# Patient Record
Sex: Female | Born: 1955
Health system: Southern US, Community
[De-identification: ages and names within clinical notes are randomized; demographics above are authoritative.]

## PROBLEM LIST (undated history)

## (undated) DIAGNOSIS — J189 Pneumonia, unspecified organism: Secondary | ICD-10-CM

## (undated) DIAGNOSIS — Z974 Presence of external hearing-aid: Secondary | ICD-10-CM

## (undated) DIAGNOSIS — K219 Gastro-esophageal reflux disease without esophagitis: Secondary | ICD-10-CM

## (undated) DIAGNOSIS — T7840XA Allergy, unspecified, initial encounter: Secondary | ICD-10-CM

## (undated) DIAGNOSIS — D509 Iron deficiency anemia, unspecified: Secondary | ICD-10-CM

## (undated) DIAGNOSIS — H269 Unspecified cataract: Secondary | ICD-10-CM

## (undated) DIAGNOSIS — Z9889 Other specified postprocedural states: Secondary | ICD-10-CM

## (undated) DIAGNOSIS — F32A Depression, unspecified: Secondary | ICD-10-CM

## (undated) DIAGNOSIS — D689 Coagulation defect, unspecified: Secondary | ICD-10-CM

## (undated) DIAGNOSIS — M1990 Unspecified osteoarthritis, unspecified site: Secondary | ICD-10-CM

## (undated) DIAGNOSIS — Z5189 Encounter for other specified aftercare: Secondary | ICD-10-CM

## (undated) DIAGNOSIS — F419 Anxiety disorder, unspecified: Secondary | ICD-10-CM

## (undated) DIAGNOSIS — E039 Hypothyroidism, unspecified: Secondary | ICD-10-CM

## (undated) DIAGNOSIS — I10 Essential (primary) hypertension: Secondary | ICD-10-CM

## (undated) DIAGNOSIS — E785 Hyperlipidemia, unspecified: Secondary | ICD-10-CM

## (undated) DIAGNOSIS — R112 Nausea with vomiting, unspecified: Secondary | ICD-10-CM

## (undated) DIAGNOSIS — C801 Malignant (primary) neoplasm, unspecified: Secondary | ICD-10-CM

## (undated) DIAGNOSIS — D649 Anemia, unspecified: Secondary | ICD-10-CM

## (undated) DIAGNOSIS — G4730 Sleep apnea, unspecified: Secondary | ICD-10-CM

## (undated) HISTORY — PX: APPENDECTOMY: SHX54

## (undated) HISTORY — DX: Coagulation defect, unspecified: D68.9

## (undated) HISTORY — DX: Hypothyroidism, unspecified: E03.9

## (undated) HISTORY — PX: BLEPHAROPLASTY: SUR158

## (undated) HISTORY — DX: Depression, unspecified: F32.A

## (undated) HISTORY — DX: Encounter for other specified aftercare: Z51.89

## (undated) HISTORY — DX: Allergy, unspecified, initial encounter: T78.40XA

## (undated) HISTORY — PX: BREAST BIOPSY: SHX20

## (undated) HISTORY — DX: Anemia, unspecified: D64.9

## (undated) HISTORY — PX: HEEL SPUR SURGERY: SHX665

## (undated) HISTORY — DX: Essential (primary) hypertension: I10

## (undated) HISTORY — DX: Unspecified cataract: H26.9

## (undated) HISTORY — DX: Hyperlipidemia, unspecified: E78.5

## (undated) HISTORY — PX: VAGINAL HYSTERECTOMY: SUR661

## (undated) HISTORY — DX: Iron deficiency anemia, unspecified: D50.9

## (undated) HISTORY — DX: Sleep apnea, unspecified: G47.30

---

## 1985-08-16 DIAGNOSIS — Z862 Personal history of diseases of the blood and blood-forming organs and certain disorders involving the immune mechanism: Secondary | ICD-10-CM

## 1985-08-16 HISTORY — DX: Personal history of diseases of the blood and blood-forming organs and certain disorders involving the immune mechanism: Z86.2

## 1999-03-26 ENCOUNTER — Other Ambulatory Visit: Admission: RE | Admit: 1999-03-26 | Discharge: 1999-03-26 | Payer: Self-pay | Admitting: *Deleted

## 1999-03-26 ENCOUNTER — Encounter (INDEPENDENT_AMBULATORY_CARE_PROVIDER_SITE_OTHER): Payer: Self-pay | Admitting: Specialist

## 2001-01-10 ENCOUNTER — Other Ambulatory Visit: Admission: RE | Admit: 2001-01-10 | Discharge: 2001-01-10 | Payer: Self-pay | Admitting: *Deleted

## 2002-08-24 ENCOUNTER — Other Ambulatory Visit: Admission: RE | Admit: 2002-08-24 | Discharge: 2002-08-24 | Payer: Self-pay | Admitting: Obstetrics and Gynecology

## 2003-08-27 ENCOUNTER — Other Ambulatory Visit: Admission: RE | Admit: 2003-08-27 | Discharge: 2003-08-27 | Payer: Self-pay | Admitting: Obstetrics and Gynecology

## 2005-09-15 ENCOUNTER — Observation Stay (HOSPITAL_COMMUNITY): Admission: RE | Admit: 2005-09-15 | Discharge: 2005-09-16 | Payer: Self-pay | Admitting: Obstetrics and Gynecology

## 2005-09-15 ENCOUNTER — Encounter (INDEPENDENT_AMBULATORY_CARE_PROVIDER_SITE_OTHER): Payer: Self-pay | Admitting: Specialist

## 2005-10-09 ENCOUNTER — Inpatient Hospital Stay (HOSPITAL_COMMUNITY): Admission: AD | Admit: 2005-10-09 | Discharge: 2005-10-09 | Payer: Self-pay | Admitting: Obstetrics and Gynecology

## 2007-11-28 ENCOUNTER — Ambulatory Visit: Payer: Self-pay | Admitting: Gastroenterology

## 2007-12-11 ENCOUNTER — Ambulatory Visit: Payer: Self-pay | Admitting: Gastroenterology

## 2008-05-23 ENCOUNTER — Ambulatory Visit: Payer: Self-pay | Admitting: Endocrinology

## 2008-06-18 ENCOUNTER — Ambulatory Visit: Payer: Self-pay | Admitting: Internal Medicine

## 2008-06-18 DIAGNOSIS — J45909 Unspecified asthma, uncomplicated: Secondary | ICD-10-CM | POA: Insufficient documentation

## 2008-06-18 DIAGNOSIS — E039 Hypothyroidism, unspecified: Secondary | ICD-10-CM | POA: Insufficient documentation

## 2008-06-18 DIAGNOSIS — J8409 Other alveolar and parieto-alveolar conditions: Secondary | ICD-10-CM | POA: Insufficient documentation

## 2008-06-18 DIAGNOSIS — J42 Unspecified chronic bronchitis: Secondary | ICD-10-CM | POA: Insufficient documentation

## 2008-06-18 DIAGNOSIS — M7730 Calcaneal spur, unspecified foot: Secondary | ICD-10-CM | POA: Insufficient documentation

## 2008-06-18 DIAGNOSIS — R05 Cough: Secondary | ICD-10-CM

## 2008-06-18 DIAGNOSIS — T7840XA Allergy, unspecified, initial encounter: Secondary | ICD-10-CM | POA: Insufficient documentation

## 2008-06-18 DIAGNOSIS — I10 Essential (primary) hypertension: Secondary | ICD-10-CM | POA: Insufficient documentation

## 2008-06-18 DIAGNOSIS — R059 Cough, unspecified: Secondary | ICD-10-CM | POA: Insufficient documentation

## 2008-08-08 ENCOUNTER — Emergency Department (HOSPITAL_COMMUNITY): Admission: EM | Admit: 2008-08-08 | Discharge: 2008-08-08 | Payer: Self-pay | Admitting: Emergency Medicine

## 2008-08-19 ENCOUNTER — Ambulatory Visit: Payer: Self-pay | Admitting: Internal Medicine

## 2008-08-19 DIAGNOSIS — G4730 Sleep apnea, unspecified: Secondary | ICD-10-CM | POA: Insufficient documentation

## 2009-01-07 IMAGING — CT CT CHEST W/O CM
1 series · 15 of 33 positions shown, 19 images · non-contrast
Comparison: none

REASON FOR EXAM: Coughing, bronchitis, evaluate lung CA
COMMENTS:

[Series 2: soft tissue · axial · 0.83mm/px · z∈[-18,+287]mm · 15 of 73 slices shown, 19 images]
[im 6/73  mediastinal]
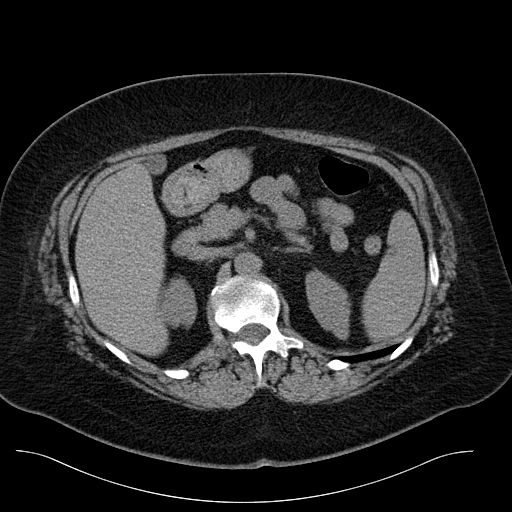
[im 6/73  lung]
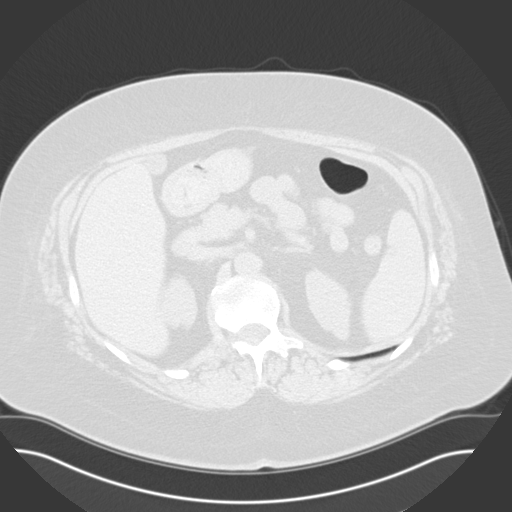
[im 11/73  lung]
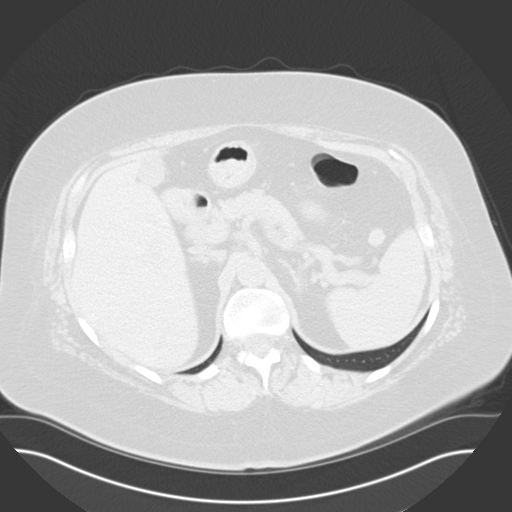
[im 15/73  lung]
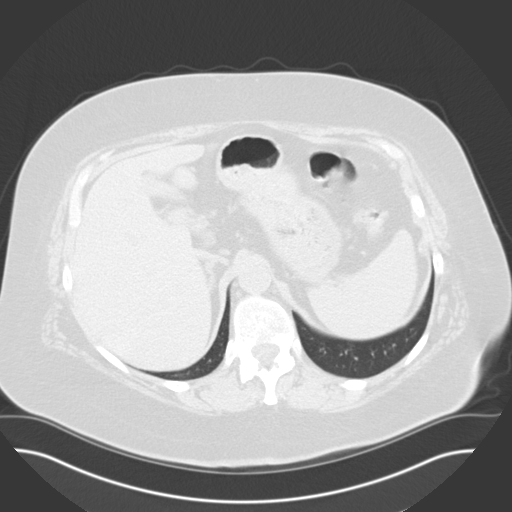
[im 19/73  lung]
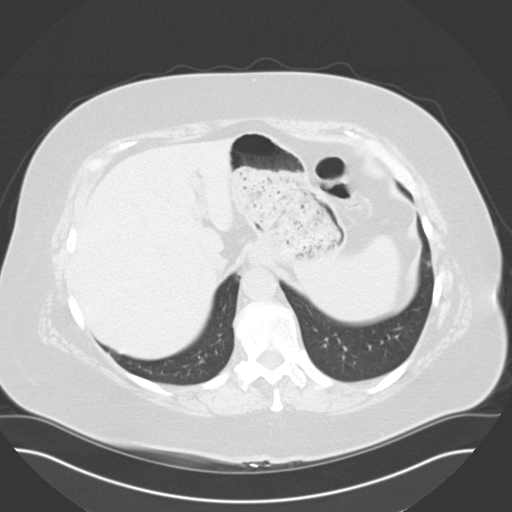
[im 25/73  mediastinal]
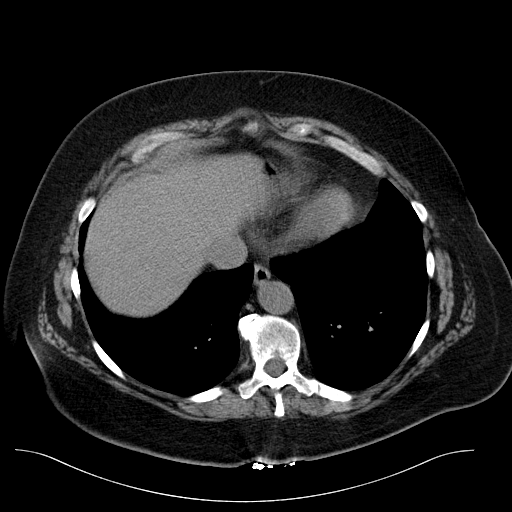
[im 25/73  lung]
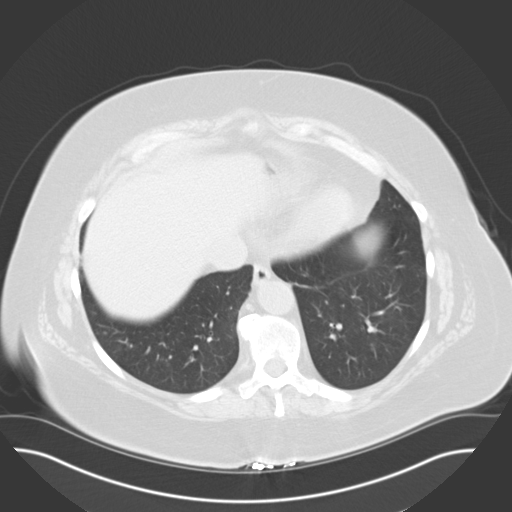
[im 29/73  lung]
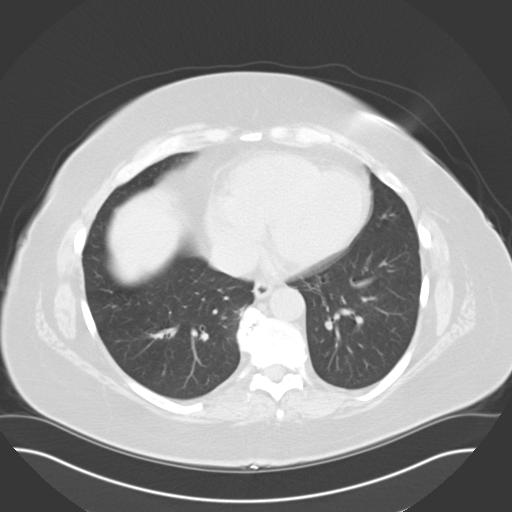
[im 33/73  lung]
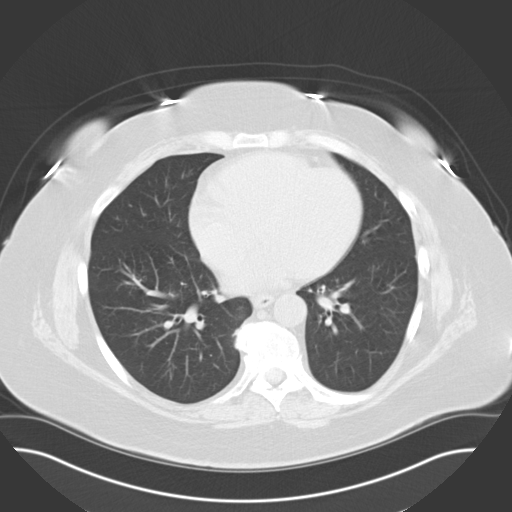
[im 38/73  lung]
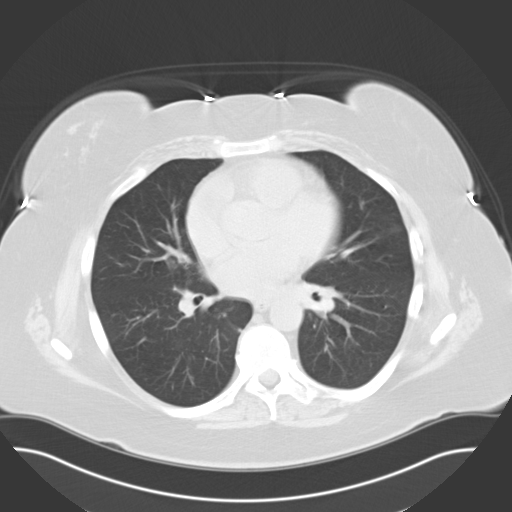
[im 41/73  mediastinal]
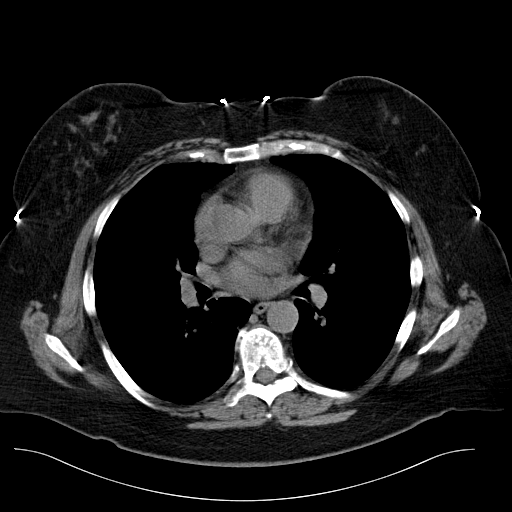
[im 41/73  lung]
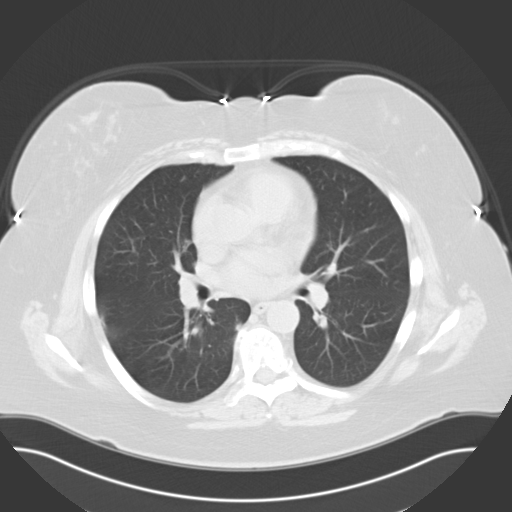
[im 44/73  lung]
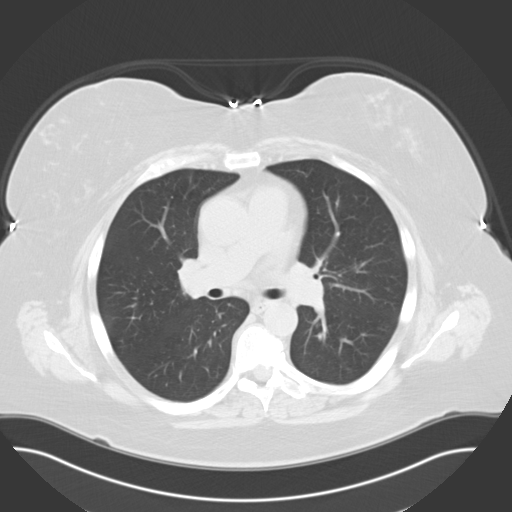
[im 49/73  lung]
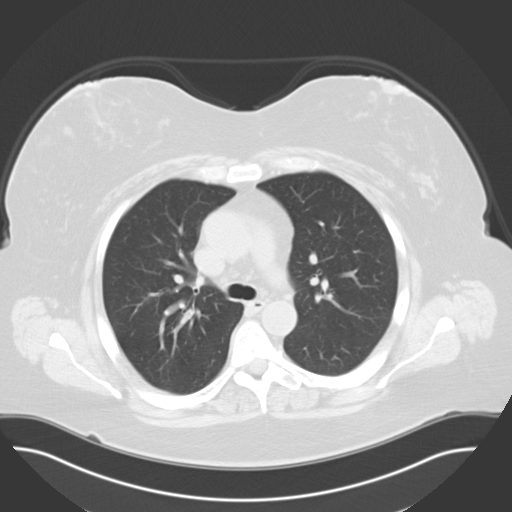
[im 54/73  lung]
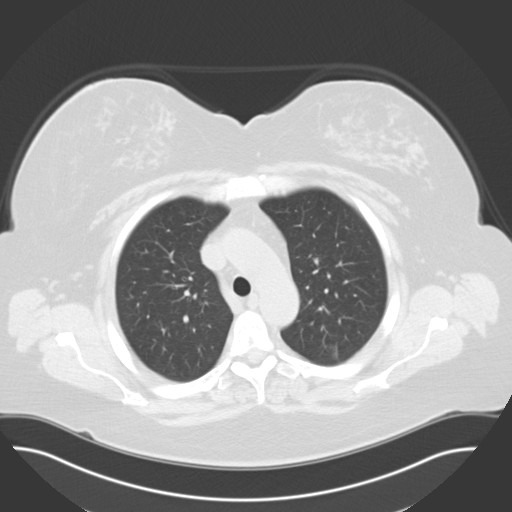
[im 58/73  mediastinal]
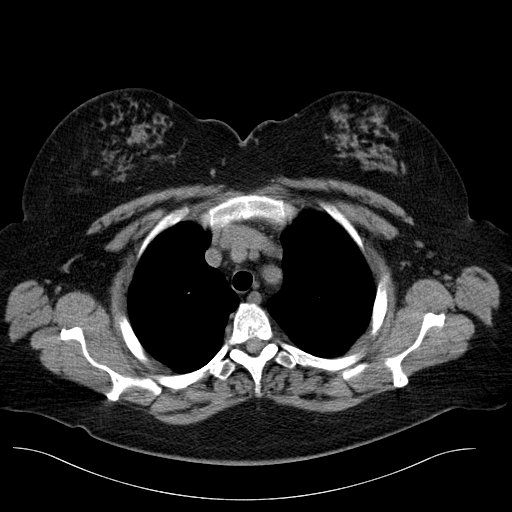
[im 58/73  lung]
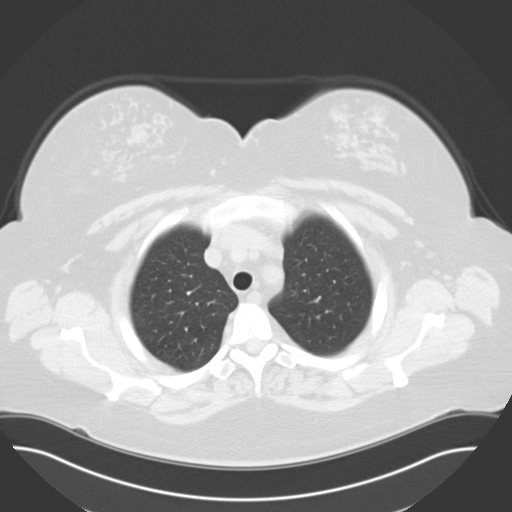
[im 62/73  lung]
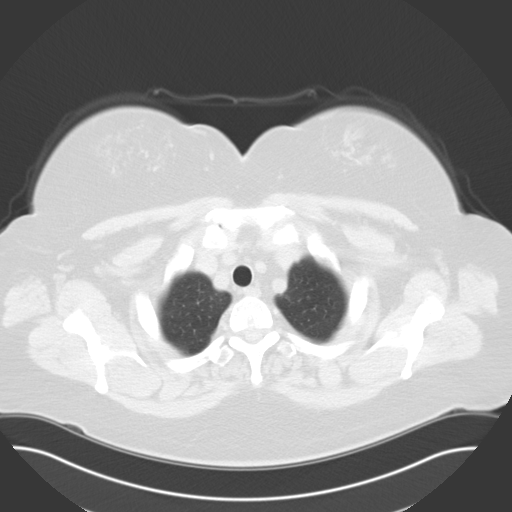
[im 67/73  lung]
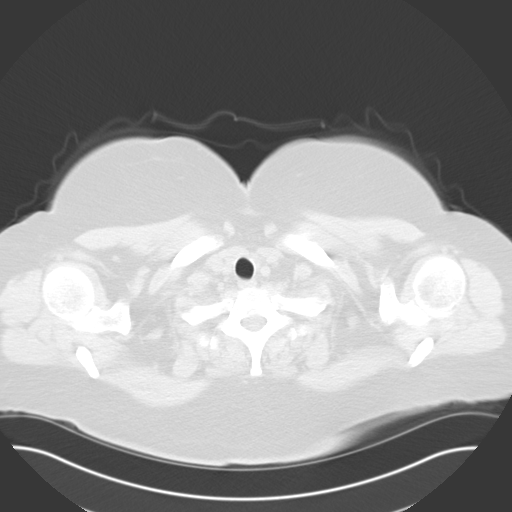

[15 of 33 positions shown; findings below may reference images not displayed]

PROCEDURE:     CT  - CT CHEST WITHOUT CONTRAST  - May 23, 2008  [DATE]

RESULT:     The study was performed without IV contrast as requested.

At lung window settings there are no alveolar infiltrates. No abnormality of
the pulmonary interstitium is demonstrated. I see no abnormal pulmonary
nodules. No bullous lesions are identified. The cardiac chambers are normal
in size. The caliber of the thoracic aorta is normal. The study is limited
in terms of sensitivity for borderline enlarged lymph nodes but no bulky
mediastinal or hilar or axillary lymphadenopathy is seen. There is no
evidence of a pleural or pericardial effusion. Within the upper abdomen, the
observed portions of the liver and spleen are normal in appearance. The
gallbladder exhibits no calcified stones.
IMPRESSION: 1.  I do not see evidence of acute pulmonary parenchymal abnormality.
2.  The study is limited without IV contrast but I see no findings
suspicious for mediastinal or hilar mass or lymphadenopathy.
3.  There is no evidence of CHF or pneumonia.
4.  There are degenerative changes of the lower thoracic spine but there is
no evidence of a compression fracture.

## 2009-02-21 ENCOUNTER — Encounter: Payer: Self-pay | Admitting: Nurse Practitioner

## 2009-03-10 ENCOUNTER — Encounter: Payer: Self-pay | Admitting: Nurse Practitioner

## 2009-03-24 ENCOUNTER — Ambulatory Visit: Payer: Self-pay | Admitting: Gastroenterology

## 2009-04-28 ENCOUNTER — Telehealth: Payer: Self-pay | Admitting: Gastroenterology

## 2009-05-02 ENCOUNTER — Ambulatory Visit: Payer: Self-pay | Admitting: Gastroenterology

## 2009-05-02 DIAGNOSIS — R1013 Epigastric pain: Secondary | ICD-10-CM | POA: Insufficient documentation

## 2009-05-02 DIAGNOSIS — K921 Melena: Secondary | ICD-10-CM | POA: Insufficient documentation

## 2009-05-02 DIAGNOSIS — D649 Anemia, unspecified: Secondary | ICD-10-CM | POA: Insufficient documentation

## 2009-05-02 DIAGNOSIS — E119 Type 2 diabetes mellitus without complications: Secondary | ICD-10-CM | POA: Insufficient documentation

## 2009-05-06 ENCOUNTER — Ambulatory Visit: Payer: Self-pay | Admitting: Gastroenterology

## 2009-05-07 ENCOUNTER — Telehealth: Payer: Self-pay | Admitting: Gastroenterology

## 2009-05-07 ENCOUNTER — Encounter: Payer: Self-pay | Admitting: Gastroenterology

## 2009-05-07 DIAGNOSIS — D500 Iron deficiency anemia secondary to blood loss (chronic): Secondary | ICD-10-CM | POA: Insufficient documentation

## 2009-05-14 ENCOUNTER — Ambulatory Visit: Payer: Self-pay | Admitting: Gastroenterology

## 2009-05-26 ENCOUNTER — Telehealth: Payer: Self-pay | Admitting: Gastroenterology

## 2009-06-12 ENCOUNTER — Telehealth: Payer: Self-pay | Admitting: Gastroenterology

## 2009-06-12 ENCOUNTER — Ambulatory Visit: Payer: Self-pay | Admitting: Gastroenterology

## 2009-06-12 DIAGNOSIS — R197 Diarrhea, unspecified: Secondary | ICD-10-CM | POA: Insufficient documentation

## 2009-06-12 LAB — CONVERTED CEMR LAB
Basophils Relative: 0.9 % (ref 0.0–3.0)
Eosinophils Absolute: 0.1 10*3/uL (ref 0.0–0.7)
Eosinophils Relative: 1.6 % (ref 0.0–5.0)
Hemoglobin: 11.4 g/dL — ABNORMAL LOW (ref 12.0–15.0)
Lymphocytes Relative: 16.4 % (ref 12.0–46.0)
Monocytes Relative: 5.3 % (ref 3.0–12.0)
Neutro Abs: 7 10*3/uL (ref 1.4–7.7)
Neutrophils Relative %: 75.8 % (ref 43.0–77.0)
RBC: 4.47 M/uL (ref 3.87–5.11)
WBC: 9.2 10*3/uL (ref 4.5–10.5)

## 2009-06-16 ENCOUNTER — Encounter: Payer: Self-pay | Admitting: Gastroenterology

## 2009-06-18 ENCOUNTER — Encounter: Payer: Self-pay | Admitting: Gastroenterology

## 2009-06-18 ENCOUNTER — Encounter (HOSPITAL_COMMUNITY): Admission: RE | Admit: 2009-06-18 | Discharge: 2009-08-15 | Payer: Self-pay | Admitting: Gastroenterology

## 2009-06-27 ENCOUNTER — Encounter: Payer: Self-pay | Admitting: Gastroenterology

## 2009-07-14 ENCOUNTER — Telehealth: Payer: Self-pay | Admitting: Gastroenterology

## 2009-07-18 ENCOUNTER — Ambulatory Visit: Payer: Self-pay | Admitting: Gastroenterology

## 2009-07-18 LAB — CONVERTED CEMR LAB
Basophils Relative: 0.6 % (ref 0.0–3.0)
Eosinophils Relative: 1.8 % (ref 0.0–5.0)
HCT: 34.7 % — ABNORMAL LOW (ref 36.0–46.0)
Hemoglobin: 11.7 g/dL — ABNORMAL LOW (ref 12.0–15.0)
MCV: 81.1 fL (ref 78.0–100.0)
Monocytes Absolute: 0.5 10*3/uL (ref 0.1–1.0)
Neutro Abs: 7.3 10*3/uL (ref 1.4–7.7)
Neutrophils Relative %: 73.5 % (ref 43.0–77.0)
RBC: 4.28 M/uL (ref 3.87–5.11)
WBC: 10 10*3/uL (ref 4.5–10.5)

## 2009-07-21 ENCOUNTER — Ambulatory Visit: Payer: Self-pay | Admitting: Gastroenterology

## 2009-07-21 LAB — CONVERTED CEMR LAB: Fecal Occult Bld: NEGATIVE

## 2010-07-24 ENCOUNTER — Encounter: Payer: Self-pay | Admitting: Gastroenterology

## 2010-08-12 ENCOUNTER — Telehealth: Payer: Self-pay | Admitting: Gastroenterology

## 2010-08-18 ENCOUNTER — Ambulatory Visit
Admission: RE | Admit: 2010-08-18 | Discharge: 2010-08-18 | Payer: Self-pay | Source: Home / Self Care | Attending: Gastroenterology | Admitting: Gastroenterology

## 2010-08-18 DIAGNOSIS — K625 Hemorrhage of anus and rectum: Secondary | ICD-10-CM | POA: Insufficient documentation

## 2010-09-17 NOTE — Assessment & Plan Note (Signed)
Summary: rectal bleeding/anemia   History of Present Illness Visit Type: Follow-up Visit Primary GI MD: Melvia Heaps MD [facility] Primary Provider: Veverly Fells Altheimer, MD  Requesting Provider: n/a Chief Complaint: BRB when patient wipes after BMs, diarrhea, and lower abd pain before BMs History of Present Illness:   Chelsey Martin has returned for evaluation of rectal bleeding.  On 2 occasions over the past 2 weeks she has noted bright red blood on the toilet tissue following a bowel movement.  She denies change in bowel habits, abdominal or rectal pain.  She has a history of a iron deficiency anemia for which she underwent extensive workup in 2010 including endoscopy, colonoscopy and capsule endoscopy.  Capsule study demonstrated a few small bowel AVMs which was felt to be the source of her iron deficiency.  She has received iron infusions in the past.  Hemoglobin in December, 2011 was 12.  In November 2010 hemoglobin was 11.6..   GI Review of Systems    Reports abdominal pain.     Location of  Abdominal pain: lower abdomen.    Denies acid reflux, belching, bloating, chest pain, dysphagia with liquids, dysphagia with solids, heartburn, loss of appetite, nausea, vomiting, vomiting blood, weight loss, and  weight gain.      Reports diarrhea and  rectal bleeding.     Denies anal fissure, black tarry stools, change in bowel habit, constipation, diverticulosis, fecal incontinence, heme positive stool, hemorrhoids, irritable bowel syndrome, jaundice, light color stool, liver problems, and  rectal pain.    Current Medications (verified): 1)  Cobal-1000 1000 Mcg/ml Soln (Cyanocobalamin) .... Use As Directed Twice A Month 2)  Super B Complex  Tabs (B Complex-C) .... Take 1 Tablet By Mouth Once A Day 3)  Allergy Vaccine .... Once A Week 4)  Hyzaar 50-12.5 Mg Tabs (Losartan Potassium-Hctz) .... Once Daily 5)  Synthroid 175 Mcg Tabs (Levothyroxine Sodium) .Marland Kitchen.. 1 By Mouth Once Daily 6)  Protonix 40 Mg Tbec  (Pantoprazole Sodium) .Marland Kitchen.. 1 By Mouth Once Daily As Needed 7)  Klor-Con 20 Meq Pack (Potassium Chloride) .... One Tablet By Mouth Once Daily 8)  Symbicort 80-4.5 Mcg/act Aero (Budesonide-Formoterol Fumarate) .... As Needed 9)  Vitamin D3 50000 Unit Caps (Cholecalciferol) .... One Capsule By Mouth Every Other Week 10)  Glucosamine 500 Mg Caps (Glucosamine Sulfate) .Marland KitchenMarland Kitchen. 1500iu Once Daily  Allergies (verified): 1)  ! * Provera 2)  ! * Peanuts  Past History:  Past Medical History: Reviewed history from 05/02/2009 and no changes required. Hx of HEEL SPUR (ICD-726.73) HYPOTHYROIDISM (ICD-244.9) ALLERGY (ICD-995.3) ASTHMA (ICD-493.90) - MILD INTERMITTENT by symptom diagnosed by Dr. Delano Metz several years ago at baseline only on albuterol as needed flares up only with "bronchitis" denies icu admits, er visits, or hospitalizations HYPERTENSION (ICD-401.9) Anemia Obesity Pneumonia Idiopathic Thrombocytopenia 1980's  Past Surgical History:  Laparoscopically-assisted vaginal hysterectomy and bilateral  salpingo-oophorectomy - 2007 Normal colonoscopy - 11/2007 Appendectomy Hysterectomy Heel Spur Surgery  Breast Biopsy Bilateral   Family History: Reviewed history from 06/12/2009 and no changes required. 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  The patient denies allergy/sinus, anxiety-new, arthritis/joint pain, blood in urine, breast changes/lumps, change in  vision, confusion, cough, coughing up blood, depression-new, fainting, fever, headaches-new, hearing problems, heart murmur, heart rhythm changes, itching, menstrual pain, night sweats, nosebleeds,  pregnancy symptoms, shortness of breath, skin rash, sleeping problems, sore throat, swelling of feet/legs, swollen lymph glands, thirst - excessive , urination - excessive , urination changes/pain, urine leakage, vision changes, and voice change.         All other systems were reviewed and were negative   Vital Signs:  Patient profile:   55 year old female Height:      68 inches Weight:      309 pounds BMI:     47.15 BSA:     2.46 Pulse rate:   88 / minute Pulse rhythm:   regular BP sitting:   136 / 80  (left arm) Cuff size:   large  Vitals Entered By: Ok Anis CMA (August 18, 2010 10:51 AM)  Physical Exam  Additional Exam:  On physical exam she is an obese female  skin: anicteric HEENT: normocephalic; PEERLA; no nasal or pharyngeal abnormalities neck: supple nodes: no cervical lymphadenopathy chest: clear to ausculatation and percussion heart: no murmurs, gallops, or rubs abd: soft, nontender; BS normoactive; no abdominal masses, tenderness, organomegaly rectal: no masses ext: no cynanosis, clubbing, edema skeletal: no deformities neuro: oriented x 3; no focal abnormalities    Impression & Recommendations:  Problem # 1:  RECTAL BLEEDING (ICD-569.3)  Limited rectal bleeding is most likely due to hemorrhoidal disease.  The patient's #1 Anusol HC suppositories #2 if bleeding becomes persistent I would consider flexible sigmoidoscopy  Problem # 2:  IRON DEFICIENCY ANEMIA SECONDARY TO BLOOD LOSS (ICD-280.0) Hemoglobin is actually improved compared to a year ago.  MCV is normal.  Recommendations #1 continue to monitor hemoglobin every 6 months.  If her hemoglobin drops or her MCV decreases I would consider repeat iron infusion  Problem # 3:  DIABETES MELLITUS-TYPE II (ICD-250.00) Assessment: Comment Only  Patient Instructions: 1)  Copy sent to : Veverly Fells Altheimer, MD  2)  Call back in 1 month for follow up labs 3)  The medication list was reviewed and  reconciled.  All changed / newly prescribed medications were explained.  A complete medication list was provided to the patient / caregiver. Prescriptions: ANUSOL-HC 25 MG SUPP (HYDROCORTISONE ACETATE) date one suppository at bedtime  #7 x 1   Entered and Authorized by:   Louis Meckel MD   Signed by:   Louis Meckel MD on 08/18/2010   Method used:   [facility]        [facility]  [address] [phone]* (retail)       [address]       [place], [place]  [address]       Ph: [phone]       Fax: [phone]   RxID:   [id number]

## 2010-09-17 NOTE — Letter (Signed)
Summary: Veverly Fells Altheimer MD  Veverly Fells Altheimer MD   Imported By: Lester Botines 08/21/2010 08:31:02  _____________________________________________________________________  External Attachment:    Type:   Image     Comment:   External Document

## 2010-09-17 NOTE — Progress Notes (Signed)
Summary: Triage  Phone Note Call from Patient Call back at Work Phone [phone]   Caller: Patient Call For: Dr. Arlyce Dice Reason for Call: Talk to Nurse Summary of Call: rectal bleeding and anemic....requesting sooner appt. than next avail. Initial call taken by: Karna Christmas,  August 12, 2010 9:08 AM  Follow-up for Phone Call        Saw brb on tissue and yesterday dripped into toilet.Seen by Dr.Althheimer on 07/24/10 and was told anemic again.Given appt. with Dr.Kaplan for 08/18/10 when he returns to the office.Records requested. Follow-up by: Teryl Lucy RN,  August 12, 2010 9:43 AM

## 2010-09-24 ENCOUNTER — Telehealth: Payer: Self-pay | Admitting: Gastroenterology

## 2010-09-24 ENCOUNTER — Other Ambulatory Visit: Payer: Self-pay

## 2010-10-01 ENCOUNTER — Other Ambulatory Visit: Payer: BC Managed Care – PPO

## 2010-10-01 ENCOUNTER — Other Ambulatory Visit: Payer: Self-pay | Admitting: Gastroenterology

## 2010-10-01 ENCOUNTER — Encounter (INDEPENDENT_AMBULATORY_CARE_PROVIDER_SITE_OTHER): Payer: Self-pay | Admitting: *Deleted

## 2010-10-01 DIAGNOSIS — D649 Anemia, unspecified: Secondary | ICD-10-CM

## 2010-10-01 LAB — CBC WITH DIFFERENTIAL/PLATELET
Eosinophils Relative: 1.3 % (ref 0.0–5.0)
HCT: 36 % (ref 36.0–46.0)
Hemoglobin: 12.3 g/dL (ref 12.0–15.0)
Lymphs Abs: 1.9 10*3/uL (ref 0.7–4.0)
MCV: 84.5 fl (ref 78.0–100.0)
Monocytes Absolute: 0.5 10*3/uL (ref 0.1–1.0)
Monocytes Relative: 5.1 % (ref 3.0–12.0)
Neutro Abs: 8 10*3/uL — ABNORMAL HIGH (ref 1.4–7.7)
RDW: 15.3 % — ABNORMAL HIGH (ref 11.5–14.6)

## 2010-10-01 NOTE — Progress Notes (Signed)
Summary: Lab reminder  Phone Note Outgoing Call Call back at [facility] Phone [phone]   Call placed by: Merri Ray CMA Duncan Dull),  September 24, 2010 3:26 PM Summary of Call: Called pt to inform that follow up CBC is due... Initial call taken by: Merri Ray CMA (AAMA),  September 24, 2010 3:27 PM     Appended Document: Lab reminder FUTURE LAB FOR CBC ENTERED IN EPIC

## 2010-10-07 ENCOUNTER — Telehealth: Payer: Self-pay | Admitting: Gastroenterology

## 2010-10-13 NOTE — Progress Notes (Signed)
Summary: Results  Phone Note Call from Patient Call back at Work Phone [phone]   Caller: Patient Call For: Dr. Arlyce Dice Reason for Call: Talk to Nurse Summary of Call: Patient is calling to get her results from lab work last week Initial call taken by: [place] Johnson,  October 07, 2010 8:54 AM  Follow-up for Phone Call        Left message to call back Follow-up by: Selinda Michaels RN,  October 07, 2010 10:52 AM  Additional Follow-up for Phone Call Additional follow up Details #1::        Spoke with patient and let her know her CBC results. Additional Follow-up by: Selinda Michaels RN,  October 07, 2010 10:54 AM

## 2011-01-01 NOTE — Op Note (Signed)
Chelsey Martin, Chelsey Martin                 ACCOUNT NO.:  [id number]   MEDICAL RECORD NO.:  [id number]          PATIENT TYPE:  OBV   LOCATION:  9305                          FACILITY:  [facility]   PHYSICIAN:  Sherry A. Dickstein, M.D.DATE OF BIRTH:  1956/01/02   DATE OF PROCEDURE:  09/15/2005  DATE OF DISCHARGE:                                 OPERATIVE REPORT   PREOPERATIVE DIAGNOSES:  1  Right ovarian cyst.  1.  Abdominal abdominal pain.   POSTOPERATIVE DIAGNOSES:  1  Right ovarian cyst.  1.  Abdominal abdominal pain.   PROCEDURE:  Laparoscopically-assisted vaginal hysterectomy and bilateral  salpingo-oophorectomy.   SURGEON:  Sherry A. Rosalio Macadamia, M.D.   ASSISTANTS:  1.  Genia Del, M.D.  2.  Gerri Spore B. Earlene Plater, M.D.   ANESTHESIA:  General   INDICATIONS:  This is a 55 year old G1, P 1-0-0-1 woman who had been having  intermittent abdominal pain.  The patient was evaluated with an ultrasound  which revealed a 7 cm right ovarian cyst.  She had a complex left ovarian  cyst which after repeat ultrasound had resolved. However, the right ovarian  cyst persisted.  Because of the persistence of the right ovarian cyst and  the abdominal pain. The patient is brought to the operating room for LAVH  and BSO.   FINDINGS:  Slightly enlarged anteflexed uterus, right paraovarian cyst  approximately 7 cm in size, normal right ovary, normal left ovary and  fallopian tube.   DESCRIPTION OF PROCEDURE:  The patient was brought into the operating room  and given adequate general anesthesia. She was placed in dorsal lithotomy  position for repair.  Her abdomen was washed with Betadine.  Her perineum  and vagina were washed with Betadine, and a speculum was placed in the  vagina with a single-tooth Hulka tenaculum placed on the cervix.  A sterile  drape was placed.  A sterile drape was placed beneath the buttocks.  A Foley  catheter was placed within the bladder.  The patient was then draped in a  sterile fashion.  A subumbilical incision was made, and the fascia was  identified.  The fascia was incised.  A pursestring stitch was taken with 0  Vicryl.  The peritoneal cavity was identified.  A  Hassan trocar was  introduced into the abdominal cavity, and the peritoneal cavity was  insufflated with carbon dioxide.  The pelvis was inspected with the above  findings.  The right adnexa was identified, and the adnexal cyst was  present.  It was hard to determine whether it was a truly a paratubal cyst  versus an ovarian cyst.  The right round ligament was cauterized using  tripolar cautery, and it was cut.  The right utero-ovarian ligament was  cauterized and cut.  The right ureter was identified.  The right  infundibulopelvic ligament was cauterized and cut and the all of the tissues  were dissected free to allow the right tube and ovaries to be separated from  all structures freely.  A 5 mm scope was placed into the abdominal cavity  through the right  port.  The right and left lateral ports were placed, which  was not dictated, but they were placed by finding free spaces free of any  vasculature.  Incisions were made after infiltrating with 0.25% Marcaine.  Small incisions were made and 5 mm trocars were introduced under direct  visualization.  Then the instruments were able to be placed through into the  peritoneal cavity under direct visualization without difficulty, and no  bleeding was present.  Therefore the 5 mm scope was introduced into the  peritoneal cavity through the umbilical incision.  A EndoCatch bag was  placed, and the tube and ovary was placed into the EndoCatch bag.  This was  then removed through the umbilical incision to be able to remove it.  The  fluid had to be drained and the tube and ovary was then removed through the  EndoCatch bag.  The Nezhat instrument that had been used to suction some of  the fluid was changed.  The surgeon's gloves were changed, and the  Roseanne Reno  was reintroduced into the abdominal cavity and the carbon dioxide was  reintroduced to reinsufflate the abdomen.  The 0 mm scope was replaced  through the umbilicus, and the rest of the procedure was performed.  The  left round ligament was then cauterized with tripolar.  The left  infundibulopelvic ligament was cauterized after having identified the ureter  well down in the pelvis.  The left IP was cauterized and mesosalpinx was  cauterized over to the cardinal ligaments.  The left uterine arteries were  identified and cauterized but these were not cut.  The anterior leaf of the  broad ligament was cauterized and cut.  The bladder flap was developed using  blunt and sharp dissection and cautery across to the right side.  The  right  uterine artery was then identified and cauterized again not cut in this  area.  Once all of this was then well dissected, decision was made to go  from below.   The patient was placed in a steep dorsal lithotomy position.  A weighted  speculum was placed within the vagina.  The cervix was infiltrated with 1%  Xylocaine with epinephrine circumferentially.  The incision was made around  the cervix.  The posterior cul-de-sac was then entered sharply.  The  incision was made across this area.  The uterosacral ligaments were clamped,  cut and suture ligated.  The vaginal cuff was closed with 0  Vicryl in a  running locked whipped stitch to prevent bleeding during the rest of the  surgery.  The anterior cul-de-sac was entered using blunt dissection.  Using  a long LigaSure the cardinal ligaments were clamped x2-3 on each side with  cautery and then cut with Masterson clamps.  This was done on alternating  sides until the specimen could be removed in its entirety.  There were some  bleeders along the left cardinal ligament. This was both cauterized and then sutured with 0 Vicryl figure-of-eight stitches. Adequate hemostasis was felt  to be present.  There  was some bleeding along the posterior cuff.  This was  stopped with 0 Vicryl figure-of-eight stitches.  The vaginal cuff was then  closed with 0 Vicryl figure-of-eight stitches.  Adequate hemostasis was  present.  Once this was completely closed, and there was adequate  hemostasis, all instruments were taken out of the vagina. The surgeon's  gloves were changed.  The laparoscope was reintroduced into the abdominal  cavity.  The abdomen  was reinsufflated.  The pelvis was inspected.  There  was a  small amount of bleeding along the right vasculature.  This was  cauterized.  The pelvis was irrigated and suctioned.  There was adequate  hemostasis present.  All carbon dioxide was allowed to escape after having  suctioned out the remaining fluid.  The ports were removed under direct  visualization.  The Hasson was removed.  The umbilical incision was closed  over a finger to assure no bowel being caught.  The 0 Vicryl was closed.  The umbilical incision was closed using 4-0 Monocryl in subcuticular running  stitch.  The 5 mm incisions were closed using Dermabond.  The sac and both  incisions were also closed using Dermabond.  Adhesive was placed over the  subumbilical incision.  The patient was taken out of the dorsal lithotomy  position.  She was awakened.  She was extubated.  She was moved from the  operating table and moved to a  stretcher in stable condition.  Complications were none.  Estimated blood loss 200 mL.  Specimen was uterus,  tubes and ovaries.      Sherry A. Rosalio Macadamia, M.D.  Electronically Signed     SAD/MEDQ  D:  09/15/2005  T:  09/15/2005  Job:  [id number]

## 2011-05-21 LAB — URINALYSIS, ROUTINE W REFLEX MICROSCOPIC
Glucose, UA: NEGATIVE mg/dL
Ketones, ur: NEGATIVE mg/dL
pH: 7 (ref 5.0–8.0)

## 2011-11-19 ENCOUNTER — Telehealth: Payer: Self-pay | Admitting: Gastroenterology

## 2011-11-19 NOTE — Telephone Encounter (Signed)
Pt states that she is having some bleeding from her rectum. States that when she wipes there has been some bright red blood. Pt scheduled to see Willette Cluster Monday 11/22/11@9 :30am. Pt aware of appt date and time.

## 2011-11-22 ENCOUNTER — Ambulatory Visit (INDEPENDENT_AMBULATORY_CARE_PROVIDER_SITE_OTHER): Payer: BC Managed Care – PPO | Admitting: Nurse Practitioner

## 2011-11-22 ENCOUNTER — Encounter: Payer: Self-pay | Admitting: Nurse Practitioner

## 2011-11-22 VITALS — BP 110/86 | HR 78 | Ht 66.0 in | Wt 267.8 lb

## 2011-11-22 DIAGNOSIS — K644 Residual hemorrhoidal skin tags: Secondary | ICD-10-CM

## 2011-11-22 DIAGNOSIS — K625 Hemorrhage of anus and rectum: Secondary | ICD-10-CM

## 2011-11-22 MED ORDER — HYDROCORTISONE ACETATE 25 MG RE SUPP: 25.0000 mg | Freq: Two times a day (BID) | RECTAL | Status: DC

## 2011-11-22 MED ORDER — HYDROCORTISONE 2.5 % RE CREA: TOPICAL_CREAM | Freq: Two times a day (BID) | RECTAL | Status: AC

## 2011-11-22 NOTE — Patient Instructions (Signed)
We sent prescriptions to [facility], for rectal suppositories to use internally and rectal cream to use outside the rectum.   Take a warm bath ( SITZ BATH) 3 times daily. We gave you an appointment with Gunnar Fusi on 12-07-2011 at 9 AM.

## 2011-11-22 NOTE — Progress Notes (Signed)
Chelsey Martin [id number] [birth year]   HISTORY OR PRESENT ILLNESS  Chelsey Martin is a 56 year old female seen in the past by Dr. Arlyce Dice for iron deficiency anemia which was ultimately felt to be secondary to small bowel AVMs. She had a normal colonoscopy was April 2009. Her last visit here was January 2012 and that was for evaluation of rectal bleeding. Bleeding was felt to be hemorrhoidal in nature, she was treated with Anusol suppositories and had complete resolution of the bleeding.  Patient comes in for evaluation of recurrent rectal bleeding. Three days ago (on Friday) while showering she passed a small amount of a red blood from rectum. Later that day she had more rectal bleeding with her bowel movements. Bleeding was associated with rectal discomfort. She has not had any further bleeding, bowel movements are now normal again. Patient denies any recent constipation, her stools always tend to be on the loose side.  Current Medications, Allergies, Past Medical History, Past Surgical History, Family History and Social History were reviewed in [employer] record.   PHYSICAL EXAMINATION : General:  Well developed  female in no acute distress Head: Normocephalic and atraumatic Eyes:  sclerae anicteric,conjunctive pink. Ears: Normal auditory acuity Lungs: Clear throughout to auscultation Heart: Regular rate and rhythm Abdomen: Soft, nondistended, nontender. No masses or hepatomegaly noted. Normal bowel sounds Rectal:Two inflamed external hemorrhoids, one of which is firm, tender, slightly blue. Unable to perform digital rectal exam or endoscopy secondary to patient's discomfort. Musculoskeletal: Symmetrical with no gross deformities  Extremities: No edema or deformities noted Neurological: Oriented, grossly nonfocal Psychological:  Alert and cooperative. Normal mood and affect  ASSESSMENT AND PLAN :  1. hemorrhoids with pain and bleeding. One of the hemorrhoids is very  tender and firm but not totally thrombosed. Will treat with steroid cream, sitz baths. Need to see her back 7-10 days. If conservative measures fail she will need surgical evaluation.   2, Rectal bleeding, likely secondary to #1. She may have internal hemorrhoids but didn't perform anoscopy secondary to pain.Will treat empirically with steroid suppositories.

## 2011-11-23 NOTE — Progress Notes (Signed)
Agree with Ms. Guenther's assessment and plan. Marena Witts E. Amar Sippel, MD, FACG   

## 2011-12-07 ENCOUNTER — Ambulatory Visit (INDEPENDENT_AMBULATORY_CARE_PROVIDER_SITE_OTHER): Payer: BC Managed Care – PPO | Admitting: Nurse Practitioner

## 2011-12-07 ENCOUNTER — Encounter: Payer: Self-pay | Admitting: Nurse Practitioner

## 2011-12-07 ENCOUNTER — Encounter: Payer: Self-pay | Admitting: Gastroenterology

## 2011-12-07 DIAGNOSIS — K649 Unspecified hemorrhoids: Secondary | ICD-10-CM

## 2011-12-07 MED ORDER — HYDROCORTISONE ACETATE 25 MG RE SUPP: 25.0000 mg | Freq: Two times a day (BID) | RECTAL | Status: AC

## 2011-12-07 NOTE — Patient Instructions (Signed)
Follow up as needed Refill of Anusol sent to pharmacy for you to use as needed

## 2011-12-08 ENCOUNTER — Encounter: Payer: Self-pay | Admitting: Nurse Practitioner

## 2011-12-08 NOTE — Progress Notes (Signed)
TAESHA GOODELL [id number] 1956/05/21   HISTORY OR PRESENT ILLNESS : Ms. Men is a 56 year old female who I saw a couple of weeks ago for a partially thrombosed hemorrhoid. She returns for followup as requested and after using steroid suppositories, her rectal bleeding has completely resolved, She has no discomfort and the hemorrhoid has all but disappeared. She had a normal colonoscopy in April 2009.    Current Medications, Allergies, Past Medical History, Past Surgical History, Family History and Social History were reviewed in [employer] record.   PHYSICAL EXAMINATION :  General: Well developed female in no acute distress  Head: Normocephalic and atraumatic  Eyes: sclerae anicteric,conjunctive pink.  Ears: Normal auditory acuity  Neuro:  Alert and oriented Psych: pleasant    ASSESSMENT AND PLAN :  Hemorrhoid, partially thrombosed. Resolved. She responded nicely to conservative measures. We discussed hemorrhoids including causes, prevention, various treatments, etc. Patient will call us if she has any recurrent problems.

## 2011-12-09 NOTE — Progress Notes (Signed)
Reviewed and agree.

## 2012-11-14 ENCOUNTER — Other Ambulatory Visit: Payer: Self-pay | Admitting: Dermatology

## 2013-02-28 ENCOUNTER — Other Ambulatory Visit: Payer: Self-pay | Admitting: Dermatology

## 2014-04-29 ENCOUNTER — Ambulatory Visit (INDEPENDENT_AMBULATORY_CARE_PROVIDER_SITE_OTHER): Payer: BC Managed Care – PPO | Admitting: Nurse Practitioner

## 2014-04-29 ENCOUNTER — Encounter: Payer: Self-pay | Admitting: Nurse Practitioner

## 2014-04-29 VITALS — BP 126/78 | HR 76 | Ht 67.32 in | Wt 262.2 lb

## 2014-04-29 DIAGNOSIS — R194 Change in bowel habit: Secondary | ICD-10-CM

## 2014-04-29 DIAGNOSIS — R198 Other specified symptoms and signs involving the digestive system and abdomen: Secondary | ICD-10-CM

## 2014-04-29 NOTE — Patient Instructions (Addendum)
Call us in 1 week with a condition update, after modifying your diet.  Ask for Rollene Fare, nurse.

## 2014-04-29 NOTE — Progress Notes (Signed)
     History of Present Illness:   Patient is a 58 year old female known to Dr. Deatra Ina. She has a history of iron deficiency anemia, felt secondary to small bowel AVMs. Patient comes in today for evaluation of diarrhea. Her normal bowel habit is that of 2-3 soft to loose BMs daily. Over the last 6 weeks bowel movements have doubled in frequency. Patient saw PCP, apparently stool for C. Difficile was negative and basic labs normal. She had a partial response to an empirical course of antibiotics then symptoms recurred. She has been taking 2 Imodium in the mornings. No associated abdominal pain. She is not having any nocturnal diarrhea. No blood in stools. No recent medication changes. Of note, patient is participating in Weight Watchers and consuming more fresh fruits and vegetables in her diet.    Current Medications, Allergies, Past Medical History, Past Surgical History, Family History and Social History were reviewed in [employer] record.  Physical Exam: General: Pleasant, well developed , white female in no acute distress Head: Normocephalic and atraumatic Eyes:  sclerae anicteric, conjunctiva pink  Ears: Normal auditory acuity Lungs: Clear throughout to auscultation Heart: Regular rate and rhythm Abdomen: Soft, non distended, mild right lower quadrant tenderness. No masses, no hepatomegaly. Normal bowel sounds Musculoskeletal: Symmetrical with no gross deformities  Extremities: No edema  Neurological: Alert oriented x 4, grossly nonfocal Psychological:  Alert and cooperative. Normal mood and affect  Assessment and Recommendations:   58 year old female with bowel habit changes. Increased consistency and frequency of stools may be related to dietary changes made with Weight Watchers (eating a lot more fresh fruits and vegetables). Recent C. Difficile negative. We discussed options for further evaluation. At this point it seems most reasonable to reduce consumption  of fresh fruits and vegetables to see if diarrhea slows down. She will also decrease caffeine consumption. If diarrhea improves then bowel changes likely related to diet in which case she can just take Lomotil or Imodium as needed. If diarrhea does not improve with dietary changes then she will need further workup, possibly to include colonoscopy with random biopsies. Patient will call with condition update in a couple of weeks. Of note, she had a normal screening colonoscopy April 2009

## 2014-04-30 ENCOUNTER — Encounter: Payer: Self-pay | Admitting: Nurse Practitioner

## 2014-04-30 DIAGNOSIS — R194 Change in bowel habit: Secondary | ICD-10-CM | POA: Insufficient documentation

## 2014-05-01 NOTE — Progress Notes (Signed)
Reviewed and agree with management. Malka Bocek D. Rubylee Zamarripa, M.D., FACG  

## 2014-05-16 ENCOUNTER — Other Ambulatory Visit: Payer: Self-pay | Admitting: Family Medicine

## 2014-05-16 ENCOUNTER — Ambulatory Visit
Admission: RE | Admit: 2014-05-16 | Discharge: 2014-05-16 | Disposition: A | Discharge status: Home / Self Care | Payer: BC Managed Care – PPO | Source: Ambulatory Visit | Attending: Family Medicine | Admitting: Family Medicine

## 2014-05-16 DIAGNOSIS — J189 Pneumonia, unspecified organism: Secondary | ICD-10-CM

## 2014-05-16 DIAGNOSIS — J45901 Unspecified asthma with (acute) exacerbation: Secondary | ICD-10-CM

## 2014-10-22 ENCOUNTER — Other Ambulatory Visit: Payer: Self-pay | Admitting: Physician Assistant

## 2014-12-11 ENCOUNTER — Other Ambulatory Visit: Payer: Self-pay | Admitting: *Deleted

## 2014-12-11 DIAGNOSIS — R609 Edema, unspecified: Secondary | ICD-10-CM

## 2014-12-17 ENCOUNTER — Encounter: Payer: Self-pay | Admitting: Vascular Surgery

## 2014-12-18 ENCOUNTER — Encounter: Payer: Self-pay | Admitting: Vascular Surgery

## 2014-12-18 ENCOUNTER — Ambulatory Visit (HOSPITAL_COMMUNITY)
Admission: RE | Admit: 2014-12-18 | Discharge: 2014-12-18 | Disposition: A | Discharge status: Home / Self Care | Payer: BLUE CROSS/BLUE SHIELD | Source: Ambulatory Visit | Attending: Vascular Surgery | Admitting: Vascular Surgery

## 2014-12-18 ENCOUNTER — Ambulatory Visit (INDEPENDENT_AMBULATORY_CARE_PROVIDER_SITE_OTHER): Payer: BLUE CROSS/BLUE SHIELD | Admitting: Vascular Surgery

## 2014-12-18 VITALS — BP 124/71 | HR 74 | Ht 67.0 in | Wt 277.2 lb

## 2014-12-18 DIAGNOSIS — R609 Edema, unspecified: Secondary | ICD-10-CM

## 2014-12-18 DIAGNOSIS — I872 Venous insufficiency (chronic) (peripheral): Secondary | ICD-10-CM | POA: Diagnosis not present

## 2014-12-18 NOTE — Progress Notes (Signed)
[facility] and [facility] Specialist of [facility]  Patient name: Chelsey Martin MRN: [id number] DOB: 27-May-1956 Sex: female  REASON FOR CONSULT: bilateral varicose veins (symptomatic)  HPI: Chelsey Martin is a 59 y.o. female who has had varicose veins in both lower extremities for many years. She experiences aching pain, heaviness, throbbing, and swelling in both lower extremities. The symptoms are aggravated by sitting and standing. She works sitting most of the time. She had 1 episode where she had some mild bleeding from the spider veins at her right ankle but this was many years ago. She denies any previous history of DVT. She currently does not wear compression stockings.   Past Medical History  Diagnosis Date  . Anemia   . Iron deficiency anemia   . Sleep apnea   . Hypothyroidism   . Unspecified essential hypertension   . Asthma   . Hemorrhoids    Family History  Problem Relation Age of Onset  . Pancreatic cancer Maternal Grandfather   . Emphysema Maternal Grandfather   . Heart disease Maternal Grandmother   . Colon cancer Neg Hx   . Cancer Mother   . Varicose Veins Mother   . Bleeding Disorder Mother   . Cancer Father   . Hypertension Sister    SOCIAL HISTORY: History  Substance Use Topics  . Smoking status: Never Smoker   . Smokeless tobacco: Never Used  . Alcohol Use: No   Allergies  Allergen Reactions  . Peanut-Containing Drug Products     REACTION: hives/itching   Current Outpatient Prescriptions  Medication Sig Dispense Refill  . CYANOCOBALAMIN IJ Inject 1,000 mcg as directed every 14 (fourteen) days.    . Desloratadine (CLARINEX PO) Take by mouth as needed.    . Levothyroxine Sodium (SYNTHROID PO) Take by mouth 1 day or 1 dose.    . Losartan Potassium-HCTZ (HYZAAR PO) Take by mouth 1 day or 1 dose.    . Misc Natural Products (OSTEO BI-FLEX ADV DOUBLE ST PO) Take by mouth 1 day or 1 dose.    Marland Kitchen POTASSIUM BICARBONATE PO Take by mouth.    . Vitamins A & D (VITAMIN  A & D) 10000-400 UNITS CAPS Take by mouth.     No current facility-administered medications for this visit.   REVIEW OF SYSTEMS: Valu.Nieves ] denotes positive finding; [  ] denotes negative finding  CARDIOVASCULAR:  [ ]  chest pain   [ ]  chest pressure   [ ]  palpitations   [ ]  orthopnea   [ ]  dyspnea on exertion   [ ]  claudication   [ ]  rest pain   [ ]  DVT   [ ]  phlebitis PULMONARY:   [ ]  productive cough   Valu.Nieves ] asthma   [ ]  wheezing NEUROLOGIC:   [ ]  weakness  [ ]  paresthesias  [ ]  aphasia  [ ]  amaurosis  [ ]  dizziness HEMATOLOGIC:   Valu.Nieves ] bleeding problems   [ ]  clotting disorders MUSCULOSKELETAL:  [ ]  joint pain   [ ]  joint swelling [ ]  leg swelling GASTROINTESTINAL: [ ]   blood in stool  [ ]   hematemesis GENITOURINARY:  [ ]   dysuria  [ ]   hematuria PSYCHIATRIC:  [ ]  history of major depression INTEGUMENTARY:  [ ]  rashes  [ ]  ulcers CONSTITUTIONAL:  [ ]  fever   [ ]  chills  PHYSICAL EXAM: Filed Vitals:   12/18/14 1018  BP: 124/71  Pulse: 74  Height: 5\' 7"  (1.702 m)  Weight: 277  lb 3.2 oz (125.737 kg)  SpO2: 100%   GENERAL: The patient is a well-nourished female, in no acute distress. The vital signs are documented above. CARDIOVASCULAR: There is a regular rate and rhythm. I do not detect carotid bruits. She has palpable posterior tibial pulses bilaterally. She has mild bilateral lower extremity swelling. PULMONARY: There is good air exchange bilaterally without wheezing or rales. ABDOMEN: Soft and non-tender with normal pitched bowel sounds.  MUSCULOSKELETAL: There are no major deformities or cyanosis. NEUROLOGIC: No focal weakness or paresthesias are detected. SKIN: She has simple angiectasia spider veins in her posterior left calf. On the right side she has some clustered spider veins in her distal medial right thigh and also adjacent and above her medial malleolus on the right. I do not see any large truncal varicosities. PSYCHIATRIC: The patient has a normal affect.  DATA:  I have  independently interpreted her venous duplex scan. On the right side there is no evidence of DVT. She does have deep vein reflux in the common femoral vein on the right. There is a segment of the right greater saphenous vein which has incompetent valves but the vein is not especially large ear.  On the left side, there is no evidence of DVT. There is some reflux in the common femoral vein on the left. There is no reflux in the left greater saphenous vein or short saphenous vein.  MEDICAL ISSUES:  CHRONIC VENOUS INSUFFICIENCY: The patient has chronic venous insufficiency bilaterally. We have discussed the importance of intermittent leg elevation in the proper positioning for this. In addition I have given her a prescription for knee-high compression stockings with a gradient of 15-20. I have encouraged her to avoid prolonged sitting and standing. I have encouraged her to exercise as much as possible and to consider water aerobics also as this too is helpful for patients with venous disease. If her symptoms progressed significantly we could try her in thigh-high stockings with a gradient of 20-30 mmHg. If this were not successful she could potentially be considered for laser ablation of the right greater saphenous vein in the future although currently the vein is fairly small and she is not a good candidate for that at this time. She will call if her symptoms progress.   Deitra Mayo [facility] [place]: [phone]

## 2014-12-31 IMAGING — CR DG CHEST 2V
2 series · 2 of 2 positions shown · non-contrast
Comparison: PA and lateral chest 08/25/2013.

CLINICAL DATA: Pneumonia in August 2013.

EXAM:
CHEST  2 VIEW

[w chest pa]
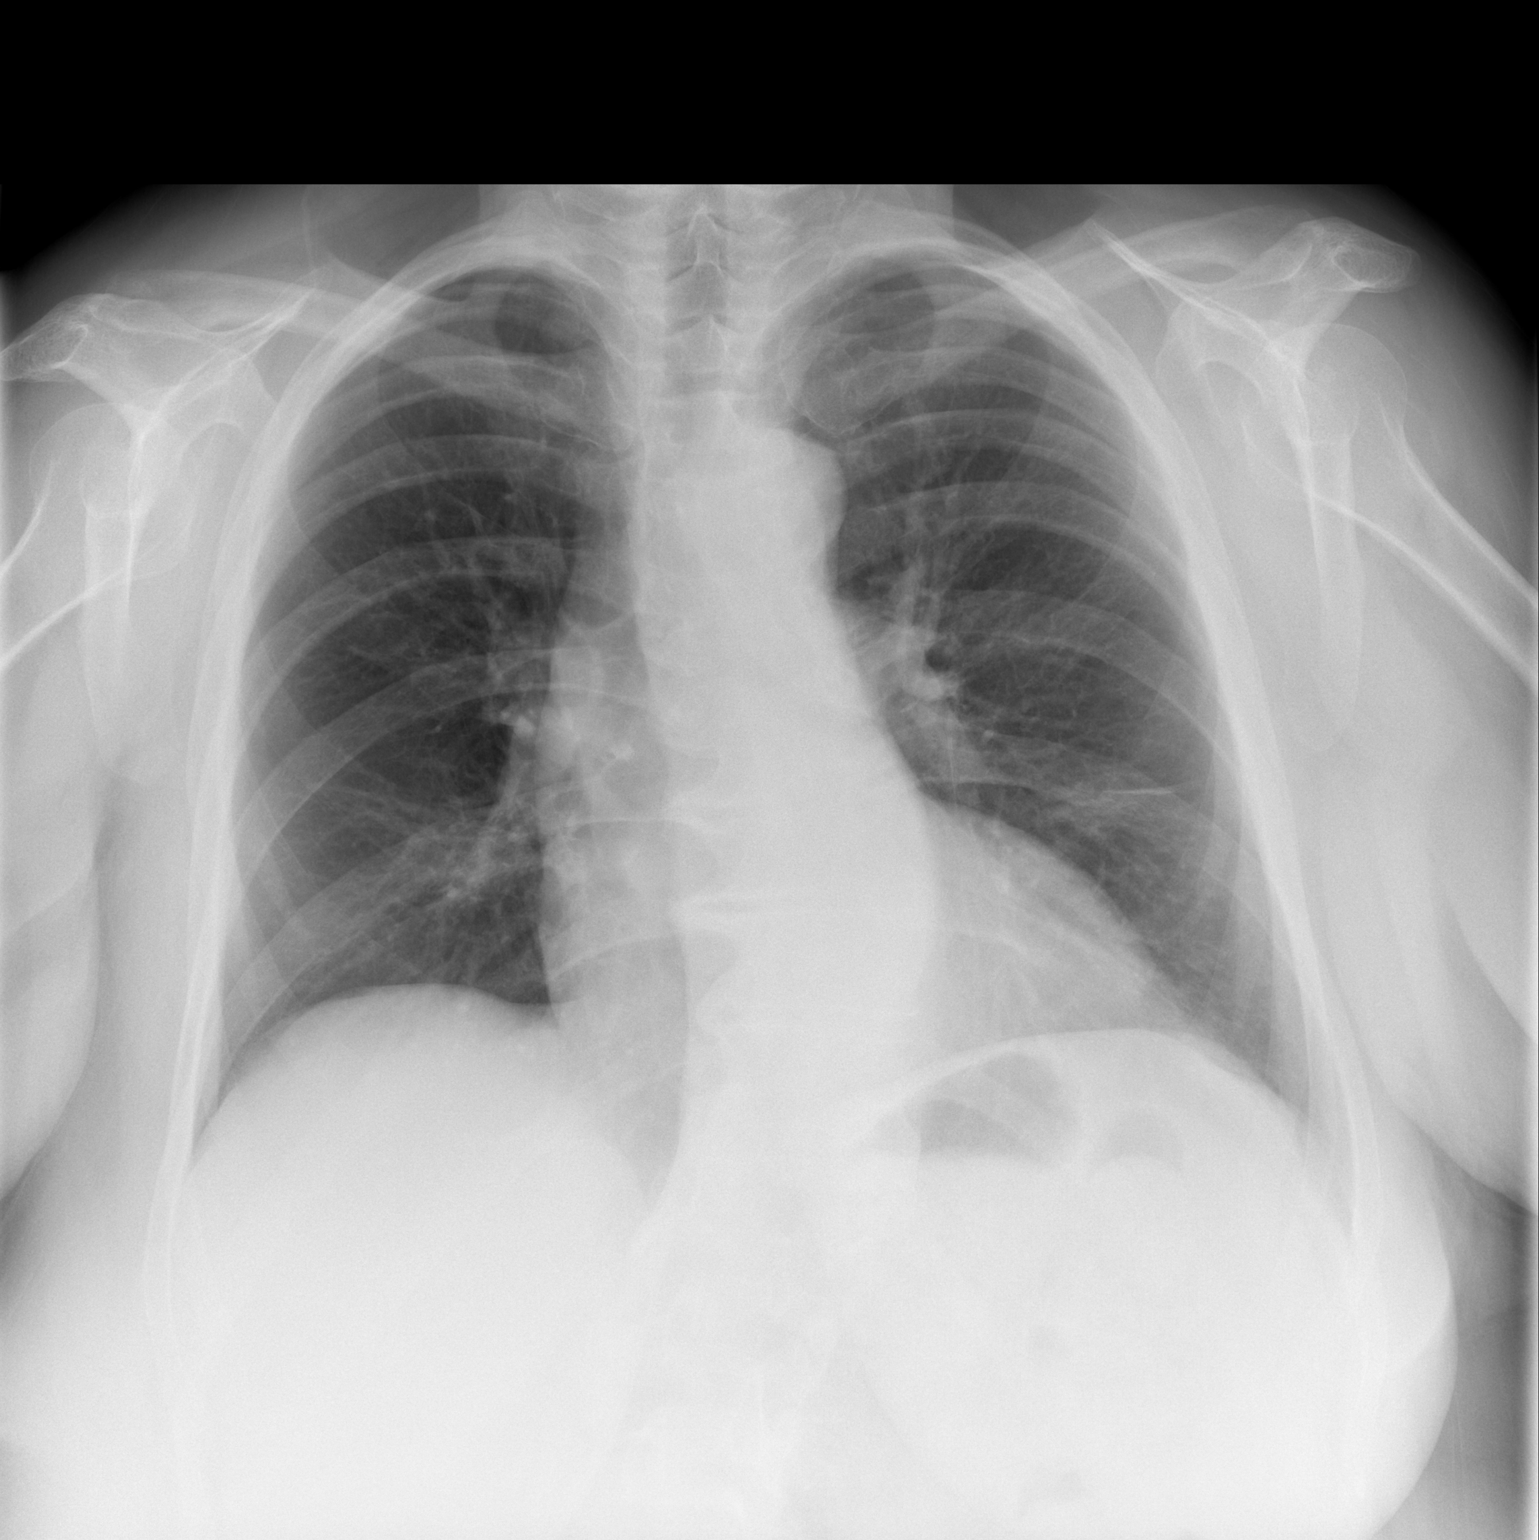

[w chest lat]
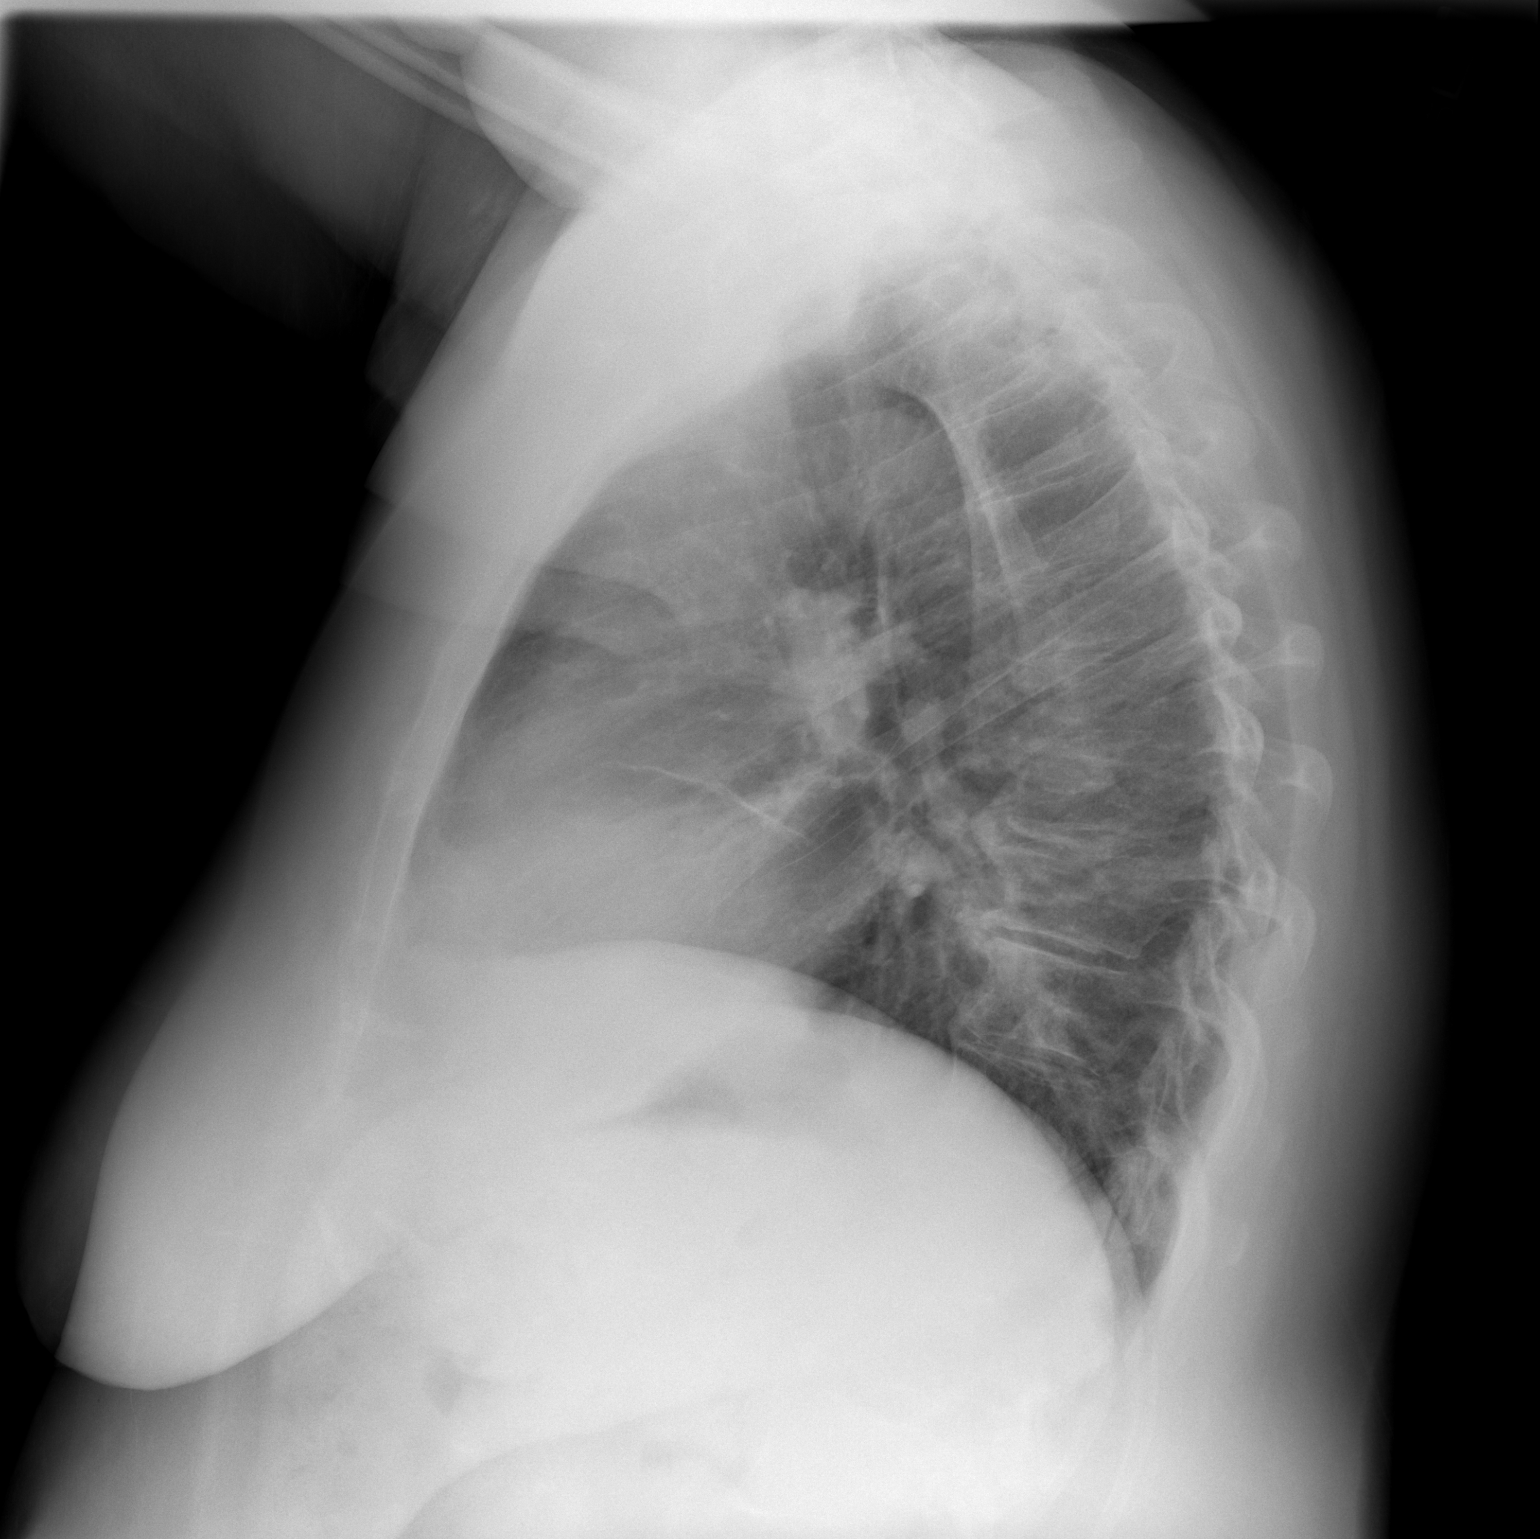

[2 of 2 positions shown; findings below may reference images not displayed]

FINDINGS: Small focus of very mild linear atelectasis or scar is seen in the
lingula. The lungs are otherwise clear. Heart size is normal. No
pneumothorax or pleural effusion. Scoliosis is noted.
IMPRESSION: No acute disease.

## 2015-11-18 DIAGNOSIS — Z79899 Other long term (current) drug therapy: Secondary | ICD-10-CM | POA: Diagnosis not present

## 2015-11-18 DIAGNOSIS — L409 Psoriasis, unspecified: Secondary | ICD-10-CM | POA: Diagnosis not present

## 2015-11-18 DIAGNOSIS — R21 Rash and other nonspecific skin eruption: Secondary | ICD-10-CM | POA: Diagnosis not present

## 2015-11-18 DIAGNOSIS — J309 Allergic rhinitis, unspecified: Secondary | ICD-10-CM | POA: Diagnosis not present

## 2015-11-18 DIAGNOSIS — R05 Cough: Secondary | ICD-10-CM | POA: Diagnosis not present

## 2015-11-18 DIAGNOSIS — J300 Vasomotor rhinitis: Secondary | ICD-10-CM | POA: Diagnosis not present

## 2015-12-18 DIAGNOSIS — I10 Essential (primary) hypertension: Secondary | ICD-10-CM | POA: Diagnosis not present

## 2015-12-18 DIAGNOSIS — J45901 Unspecified asthma with (acute) exacerbation: Secondary | ICD-10-CM | POA: Diagnosis not present

## 2015-12-18 DIAGNOSIS — J309 Allergic rhinitis, unspecified: Secondary | ICD-10-CM | POA: Diagnosis not present

## 2015-12-18 DIAGNOSIS — K219 Gastro-esophageal reflux disease without esophagitis: Secondary | ICD-10-CM | POA: Diagnosis not present

## 2016-02-24 DIAGNOSIS — Z5181 Encounter for therapeutic drug level monitoring: Secondary | ICD-10-CM | POA: Diagnosis not present

## 2016-02-24 DIAGNOSIS — L409 Psoriasis, unspecified: Secondary | ICD-10-CM | POA: Diagnosis not present

## 2016-02-24 DIAGNOSIS — Z79899 Other long term (current) drug therapy: Secondary | ICD-10-CM | POA: Diagnosis not present

## 2016-03-24 DIAGNOSIS — I10 Essential (primary) hypertension: Secondary | ICD-10-CM | POA: Diagnosis not present

## 2016-03-24 DIAGNOSIS — E038 Other specified hypothyroidism: Secondary | ICD-10-CM | POA: Diagnosis not present

## 2016-03-24 DIAGNOSIS — E7800 Pure hypercholesterolemia, unspecified: Secondary | ICD-10-CM | POA: Diagnosis not present

## 2016-04-08 DIAGNOSIS — E038 Other specified hypothyroidism: Secondary | ICD-10-CM | POA: Diagnosis not present

## 2016-04-08 DIAGNOSIS — E063 Autoimmune thyroiditis: Secondary | ICD-10-CM | POA: Diagnosis not present

## 2016-04-08 DIAGNOSIS — I10 Essential (primary) hypertension: Secondary | ICD-10-CM | POA: Diagnosis not present

## 2016-04-08 DIAGNOSIS — E7800 Pure hypercholesterolemia, unspecified: Secondary | ICD-10-CM | POA: Diagnosis not present

## 2016-04-16 DIAGNOSIS — L409 Psoriasis, unspecified: Secondary | ICD-10-CM | POA: Diagnosis not present

## 2016-04-16 DIAGNOSIS — L4059 Other psoriatic arthropathy: Secondary | ICD-10-CM | POA: Diagnosis not present

## 2016-04-16 DIAGNOSIS — M25552 Pain in left hip: Secondary | ICD-10-CM | POA: Diagnosis not present

## 2016-04-16 DIAGNOSIS — M25561 Pain in right knee: Secondary | ICD-10-CM | POA: Diagnosis not present

## 2016-04-16 DIAGNOSIS — M25562 Pain in left knee: Secondary | ICD-10-CM | POA: Diagnosis not present

## 2016-04-16 DIAGNOSIS — M25551 Pain in right hip: Secondary | ICD-10-CM | POA: Diagnosis not present

## 2016-04-16 DIAGNOSIS — M2550 Pain in unspecified joint: Secondary | ICD-10-CM | POA: Diagnosis not present

## 2016-05-31 DIAGNOSIS — L4059 Other psoriatic arthropathy: Secondary | ICD-10-CM | POA: Diagnosis not present

## 2016-05-31 DIAGNOSIS — L409 Psoriasis, unspecified: Secondary | ICD-10-CM | POA: Diagnosis not present

## 2016-07-06 DIAGNOSIS — Z79899 Other long term (current) drug therapy: Secondary | ICD-10-CM | POA: Diagnosis not present

## 2016-07-06 DIAGNOSIS — L409 Psoriasis, unspecified: Secondary | ICD-10-CM | POA: Diagnosis not present

## 2016-07-06 DIAGNOSIS — L4059 Other psoriatic arthropathy: Secondary | ICD-10-CM | POA: Diagnosis not present

## 2016-07-07 DIAGNOSIS — Z803 Family history of malignant neoplasm of breast: Secondary | ICD-10-CM | POA: Diagnosis not present

## 2016-07-07 DIAGNOSIS — Z1231 Encounter for screening mammogram for malignant neoplasm of breast: Secondary | ICD-10-CM | POA: Diagnosis not present

## 2016-07-28 DIAGNOSIS — L409 Psoriasis, unspecified: Secondary | ICD-10-CM | POA: Diagnosis not present

## 2016-07-28 DIAGNOSIS — Z79899 Other long term (current) drug therapy: Secondary | ICD-10-CM | POA: Diagnosis not present

## 2016-07-28 DIAGNOSIS — L4059 Other psoriatic arthropathy: Secondary | ICD-10-CM | POA: Diagnosis not present

## 2016-08-30 DIAGNOSIS — F419 Anxiety disorder, unspecified: Secondary | ICD-10-CM | POA: Diagnosis not present

## 2016-08-30 DIAGNOSIS — K219 Gastro-esophageal reflux disease without esophagitis: Secondary | ICD-10-CM | POA: Diagnosis not present

## 2016-08-30 DIAGNOSIS — I10 Essential (primary) hypertension: Secondary | ICD-10-CM | POA: Diagnosis not present

## 2016-10-07 DIAGNOSIS — E7800 Pure hypercholesterolemia, unspecified: Secondary | ICD-10-CM | POA: Diagnosis not present

## 2016-10-07 DIAGNOSIS — E039 Hypothyroidism, unspecified: Secondary | ICD-10-CM | POA: Diagnosis not present

## 2016-10-08 DIAGNOSIS — E063 Autoimmune thyroiditis: Secondary | ICD-10-CM | POA: Diagnosis not present

## 2016-10-08 DIAGNOSIS — E7800 Pure hypercholesterolemia, unspecified: Secondary | ICD-10-CM | POA: Diagnosis not present

## 2016-10-08 DIAGNOSIS — E559 Vitamin D deficiency, unspecified: Secondary | ICD-10-CM | POA: Diagnosis not present

## 2016-10-08 DIAGNOSIS — I10 Essential (primary) hypertension: Secondary | ICD-10-CM | POA: Diagnosis not present

## 2016-10-08 DIAGNOSIS — E876 Hypokalemia: Secondary | ICD-10-CM | POA: Insufficient documentation

## 2016-10-08 DIAGNOSIS — E038 Other specified hypothyroidism: Secondary | ICD-10-CM | POA: Insufficient documentation

## 2016-10-08 DIAGNOSIS — E538 Deficiency of other specified B group vitamins: Secondary | ICD-10-CM | POA: Insufficient documentation

## 2016-10-15 DIAGNOSIS — L4059 Other psoriatic arthropathy: Secondary | ICD-10-CM | POA: Diagnosis not present

## 2016-10-15 DIAGNOSIS — L409 Psoriasis, unspecified: Secondary | ICD-10-CM | POA: Diagnosis not present

## 2016-10-15 DIAGNOSIS — Z79899 Other long term (current) drug therapy: Secondary | ICD-10-CM | POA: Diagnosis not present

## 2016-12-17 DIAGNOSIS — L4059 Other psoriatic arthropathy: Secondary | ICD-10-CM | POA: Diagnosis not present

## 2016-12-17 DIAGNOSIS — M25551 Pain in right hip: Secondary | ICD-10-CM | POA: Diagnosis not present

## 2016-12-17 DIAGNOSIS — L409 Psoriasis, unspecified: Secondary | ICD-10-CM | POA: Diagnosis not present

## 2017-03-17 ENCOUNTER — Ambulatory Visit: Payer: Self-pay | Admitting: Adult Health

## 2017-03-17 VITALS — BP 114/76 | HR 82 | Temp 98.8°F | Wt 280.8 lb

## 2017-03-17 DIAGNOSIS — J018 Other acute sinusitis: Secondary | ICD-10-CM

## 2017-03-17 MED ORDER — AMOXICILLIN-POT CLAVULANATE 875-125 MG PO TABS: 1.0000 | ORAL_TABLET | Freq: Two times a day (BID) | ORAL | 0 refills | Status: AC

## 2017-03-17 NOTE — Progress Notes (Signed)
Subjective:     Patient ID: Chelsey Martin, female   DOB: 06-19-1956, 61 y.o.   MRN: [id number]  HPI Patient with cough for the past three weeks. She has had occasional chills at last week and denies any since the beginning of last week. She is in no acute distress. She denies any recent chest pain, shortness of breath, nausea, vomiting or diarrhea. She denies any recent exposures. She is on immunosuppressive medication for psoriatic arthritis as in medication list.  She denies any orthopnea.  She has trace edema in lower legs near ankle and she reports this is normal for her with her varicose veins she has and the ones that has been treated she has been seen by Vascular for treatment.   Patient Active Problem List   Diagnosis Date Noted  . Chronic autoimmune thyroiditis 10/08/2016  . Hypercholesterolemia 10/08/2016  . Vitamin B12 deficiency 10/08/2016  . Vitamin D deficiency 10/08/2016  . Bowel habit changes 04/30/2014  . External hemorrhoids 11/22/2011  . HYPOTHYROIDISM 06/18/2008  . HYPERTENSION 06/18/2008  . BRONCHITIS, RECURRENT 06/18/2008  . ASTHMA 06/18/2008  . HEEL SPUR 06/18/2008  . ALLERGY 06/18/2008   Review of Systems  Constitutional: Positive for chills and fatigue. Negative for activity change, appetite change, diaphoresis, fever and unexpected weight change.  HENT: Positive for postnasal drip (tickle in throat at times. ), sinus pressure and sore throat. Negative for congestion, dental problem, drooling, ear discharge, ear pain, facial swelling, hearing loss (has seen MD and needs hearing aid.), mouth sores, nosebleeds, rhinorrhea, sinus pain, sneezing, tinnitus (chronic/ has been seen and evaluated she reports/ denies any today ), trouble swallowing and voice change.   Eyes: Negative.   Respiratory: Positive for cough (Cough has postnasal drip ). Negative for apnea, choking, chest tightness, shortness of breath, wheezing and stridor.   Cardiovascular: Negative for chest pain,  palpitations and leg swelling.  Gastrointestinal: Negative for abdominal distention, abdominal pain, anal bleeding, blood in stool, constipation, diarrhea, nausea, rectal pain and vomiting.  Endocrine: Negative.   Genitourinary: Negative.   Musculoskeletal: Positive for arthralgias (psoratic arthritis/ Cosentix shots at home  once a month sees MD every three months for the past 6 months per her report/ she  has appointment next week for follow up.Dr. Marijean Bravo follows ). Negative for back pain, gait problem, joint swelling, myalgias, neck pain and neck stiffness.  Skin: Negative.        Sees Dermatology next week has psoriasis  Allergic/Immunologic: Positive for environmental allergies, food allergies and immunocompromised state (on psoratic arthritis med ).  Neurological: Positive for headaches (mild headaches occasionally denies any headaches today). Negative for dizziness, tremors, seizures, syncope, speech difficulty, weakness and numbness. Light-headedness: from new med for psoriatic arthritis per patinet she sees MD for f/u  and will discuss. Denies any currently.   Hematological: Negative for adenopathy. Does not bruise/bleed easily.  Psychiatric/Behavioral: Negative.    Blood pressure 114/76, pulse 82, temperature 98.8 F (37.1 C), temperature source Tympanic, weight 280 lb 12.8 oz (127.4 kg), SpO2 98 %.    Objective:   Physical Exam  Constitutional: She is oriented to person, place, and time. Vital signs are normal. She appears well-developed and well-nourished. She is active. No distress.  She denies any fevers at home  HENT:  Head: Normocephalic and atraumatic.  Right Ear: External ear normal. Tympanic membrane is not perforated and not erythematous. A middle ear effusion is present.  Left Ear: External ear and ear canal normal. Tympanic membrane is erythematous.  Tympanic membrane is not perforated. A middle ear effusion is present.  Nose: Mucosal edema and rhinorrhea present. Right  sinus exhibits no maxillary sinus tenderness and no frontal sinus tenderness. Left sinus exhibits frontal sinus tenderness. Left sinus exhibits no maxillary sinus tenderness.  Mouth/Throat: Uvula is midline and mucous membranes are normal. Posterior oropharyngeal erythema present.  Eyes: Pupils are equal, round, and reactive to light. Conjunctivae, EOM and lids are normal. Right eye exhibits no discharge. Left eye exhibits no discharge. No scleral icterus.  Neck: Normal range of motion. Neck supple. No JVD present. Carotid bruit is not present. No tracheal deviation present. No thyromegaly present.  Cardiovascular: Normal rate, regular rhythm, intact distal pulses and normal pulses.  Exam reveals no gallop and no friction rub.   Murmur (she reports she  has always been told she has murmur/ and is actually sheduling appointment with her mothers cardiologist) heard.  Systolic murmur is present with a grade of 1/6  Pulmonary/Chest: Effort normal and breath sounds normal. No stridor. No respiratory distress. She has no wheezes. She has no rales. She exhibits no tenderness.  Abdominal: Soft. Bowel sounds are normal. She exhibits no distension. There is no tenderness. There is no rebound.  Musculoskeletal: Normal range of motion.  Lymphadenopathy:    She has no cervical adenopathy.  Neurological: She is alert and oriented to person, place, and time. She has normal reflexes.  Skin: Skin is dry. No rash noted. She is not diaphoretic. No erythema. No pallor.  Psychiatric: She has a normal mood and affect. Her behavior is normal. Judgment and thought content normal.       Assessment:    1. Acute bacterial Sinusitis      With left mild otitis media     Plan:     1. Discussed patient with Supervising physician Dr. Miguel Aschoff. Also consulted with patients Rheumatoid Specialist Dr. Leigh Aurora due to patient being on immune suppressive drug for psoriatic  Arthritis, patient has Acute Bacterial  sinusitis with mild left otitis media, MD advised ok to prescribe Augmentin for 10 days, patient is not to take Cosentyx while on antibiotic therapy. MD informed patients next dose not due until 04/06/17 per patient. Patient is to follow up with Dr. Amil Amen for scheduled appointment next week ( 03/20/17 week) at [facility]. She can return to clinic if symptoms worsen or change or do not improve within 72 hours. She is advised to seek 911/ Emergency room/ Urgent care if after hours or symptoms change or worsen at anytime or fever develops.  She a primary care MD and appointment scheduled for September per her report has  Dr. Rosanna Randy agrees with above plan. Patient verbalized all instructions above and below.     She is advised she can use nasal saline rinse over the counter.   Medication Augmentin  E- Prescribed as below Meds ordered this encounter  Medications  . ALPRAZolam (XANAX) 0.25 MG tablet    Sig: Take 1 tablet twice a day as needed for anxiety  . DISCONTD: Apremilast 30 MG TABS    Sig: Take 1 tablet twice a day  . Cholecalciferol (VITAMIN D3) 2000 units capsule    Sig: Take 1 capsule once a day  . DISCONTD: cyanocobalamin (,VITAMIN B-12,) 1000 MCG/ML injection    Sig: INJECT 1ML TWICE A MONTH    Refill:  11  . escitalopram (LEXAPRO) 10 MG tablet    Sig: Take 10 mg by mouth daily.    Refill:  1  . mometasone-formoterol (DULERA) 100-5 MCG/ACT AERO    Sig: Use as directed as needed  . DISCONTD: predniSONE (STERAPRED UNI-PAK 48 TAB) 5 MG (48) TBPK tablet    Sig: See admin instructions.    Refill:  0  . Secukinumab (COSENTYX 300 DOSE) 150 MG/ML SOSY    Sig: Inject into the skin.  Marland Kitchen amoxicillin-clavulanate (AUGMENTIN) 875-125 MG tablet    Sig: Take 1 tablet by mouth 2 (two) times daily.    Dispense:  20 tablet    Refill:  0   1. CBC with Diff and CMET ordered due to no recent labs in Epic.  Recent Results (from the past 2160 hour(s))  CBC w/Diff     Status: Abnormal    Collection Time: 03/17/17  2:22 PM  Result Value Ref Range   WBC 9.1 3.4 - 10.8 x10E3/uL   RBC 4.73 3.77 - 5.28 x10E6/uL   Hemoglobin 12.2 11.1 - 15.9 g/dL   Hematocrit 37.8 34.0 - 46.6 %   MCV 80 79 - 97 fL   MCH 25.8 (L) 26.6 - 33.0 pg   MCHC 32.3 31.5 - 35.7 g/dL   RDW 15.6 (H) 12.3 - 15.4 %   Platelets 229 150 - 379 x10E3/uL   Neutrophils 76 Not Estab. %   Lymphs 15 Not Estab. %   Monocytes 6 Not Estab. %   Eos 2 Not Estab. %   Basos 1 Not Estab. %   Neutrophils Absolute 6.9 1.4 - 7.0 x10E3/uL   Lymphocytes Absolute 1.4 0.7 - 3.1 x10E3/uL   Monocytes Absolute 0.6 0.1 - 0.9 x10E3/uL   EOS (ABSOLUTE) 0.2 0.0 - 0.4 x10E3/uL   Basophils Absolute 0.1 0.0 - 0.2 x10E3/uL   Immature Granulocytes 0 Not Estab. %   Immature Grans (Abs) 0.0 0.0 - 0.1 x10E3/uL  Comp Met (CMET)     Status: Abnormal   Collection Time: 03/17/17  2:22 PM  Result Value Ref Range   Glucose 107 (H) 65 - 99 mg/dL   BUN 11 6 - 24 mg/dL   Creatinine, Ser 0.72 0.57 - 1.00 mg/dL   GFR calc non Af Amer 92 >59 mL/min/1.73   GFR calc Af Amer 106 >59 mL/min/1.73   BUN/Creatinine Ratio 15 9 - 23   Sodium 142 134 - 144 mmol/L   Potassium 3.6 3.5 - 5.2 mmol/L   Chloride 98 96 - 106 mmol/L   CO2 29 20 - 29 mmol/L   Calcium 9.2 8.7 - 10.2 mg/dL   Total Protein 7.1 6.0 - 8.5 g/dL   Albumin 4.3 3.5 - 5.5 g/dL   Globulin, Total 2.8 1.5 - 4.5 g/dL   Albumin/Globulin Ratio 1.5 1.2 - 2.2   Bilirubin Total 0.4 0.0 - 1.2 mg/dL   Alkaline Phosphatase 91 39 - 117 IU/L   AST 21 0 - 40 IU/L   ALT 19 0 - 32 IU/L

## 2017-03-18 ENCOUNTER — Encounter: Payer: Self-pay | Admitting: Adult Health

## 2017-03-18 LAB — COMPREHENSIVE METABOLIC PANEL
ALK PHOS: 91 IU/L (ref 39–117)
ALT: 19 IU/L (ref 0–32)
AST: 21 IU/L (ref 0–40)
Albumin/Globulin Ratio: 1.5 (ref 1.2–2.2)
Albumin: 4.3 g/dL (ref 3.5–5.5)
BUN/Creatinine Ratio: 15 (ref 9–23)
BUN: 11 mg/dL (ref 6–24)
Bilirubin Total: 0.4 mg/dL (ref 0.0–1.2)
CO2: 29 mmol/L (ref 20–29)
CREATININE: 0.72 mg/dL (ref 0.57–1.00)
Calcium: 9.2 mg/dL (ref 8.7–10.2)
Chloride: 98 mmol/L (ref 96–106)
GFR calc Af Amer: 106 mL/min/{1.73_m2} (ref >59)
GFR calc non Af Amer: 92 mL/min/{1.73_m2} (ref >59)
Globulin, Total: 2.8 g/dL (ref 1.5–4.5)
Glucose: 107 mg/dL — ABNORMAL HIGH (ref 65–99)
POTASSIUM: 3.6 mmol/L (ref 3.5–5.2)
Sodium: 142 mmol/L (ref 134–144)
TOTAL PROTEIN: 7.1 g/dL (ref 6.0–8.5)

## 2017-03-18 LAB — CBC WITH DIFFERENTIAL/PLATELET
BASOS: 1 %
Basophils Absolute: 0.1 10*3/uL (ref 0.0–0.2)
EOS (ABSOLUTE): 0.2 10*3/uL (ref 0.0–0.4)
Eos: 2 %
Hematocrit: 37.8 % (ref 34.0–46.6)
Hemoglobin: 12.2 g/dL (ref 11.1–15.9)
IMMATURE GRANS (ABS): 0 10*3/uL (ref 0.0–0.1)
Immature Granulocytes: 0 %
LYMPHS: 15 %
Lymphocytes Absolute: 1.4 10*3/uL (ref 0.7–3.1)
MCH: 25.8 pg — ABNORMAL LOW (ref 26.6–33.0)
MCHC: 32.3 g/dL (ref 31.5–35.7)
MCV: 80 fL (ref 79–97)
MONOS ABS: 0.6 10*3/uL (ref 0.1–0.9)
Monocytes: 6 %
Neutrophils Absolute: 6.9 10*3/uL (ref 1.4–7.0)
Neutrophils: 76 %
PLATELETS: 229 10*3/uL (ref 150–379)
RBC: 4.73 x10E6/uL (ref 3.77–5.28)
RDW: 15.6 % — ABNORMAL HIGH (ref 12.3–15.4)
WBC: 9.1 10*3/uL (ref 3.4–10.8)

## 2017-03-18 NOTE — Progress Notes (Signed)
Discussed labs with patient and all labs consistently improved from previous and in normal range. Labs sent to Primary Care and [facility] Dr. Amil Amen as well/

## 2017-03-18 NOTE — Patient Instructions (Addendum)
Heart Murmur- Patient will schedule cardiology visit and call back if needed. Otitis Externa Otitis externa is an infection of the outer ear canal. The outer ear canal is the area between the outside of the ear and the eardrum. Otitis externa is sometimes called "swimmer's ear." Follow these instructions at home: If you were given antibiotic ear drops, use them as told by your doctor. Do not stop using them even if your condition gets better. Take over-the-counter and prescription medicines only as told by your doctor. Keep all follow-up visits as told by your doctor. This is important. How is this prevented? Keep your ear dry. Use the corner of a towel to dry your ear after you swim or bathe. Try not to scratch or put things in your ear. Doing these things makes it easier for germs to grow in your ear. Avoid swimming in lakes, dirty water, or pools that may not have the right amount of a chemical called chlorine. Consider making ear drops and putting 3 or 4 drops in each ear after you swim. Ask your doctor about how you can make ear drops. Contact a doctor if: You have a fever. After 3 days your ear is still red, swollen, or painful. After 3 days you still have pus coming from your ear. Your redness, swelling, or pain gets worse. You have a really bad headache. You have redness, swelling, pain, or tenderness behind your ear. This information is not intended to replace advice given to you by your health care provider. Make sure you discuss any questions you have with your health care provider. Document Released: 01/19/2008 Document Revised: 08/28/2015 Document Reviewed: 05/12/2015 Elsevier Interactive Patient Education  2018 [place] heart murmur is an extra sound that is caused by chaotic blood flow. The murmur can be heard as a "hum" or "whoosh" sound when blood flows through the heart. The heart has four areas called chambers. Valves separate the upper and lower chambers from each  other (tricuspid valve and mitral valve) and separate the lower chambers of the heart from pathways that lead away from the heart (aortic valve and pulmonary valve). Normally, the valves open to let blood flow through or out of your heart, and then they shut to keep the blood from flowing backward. There are two types of heart murmurs:  Innocent murmurs. Most people with this type of heart murmur do not have a heart problem. Many children have innocent heart murmurs. Your health care provider may suggest some basic testing to find out whether your murmur is an innocent murmur. If an innocent heart murmur is found, there is no need for further tests or treatment and no need to restrict activities or stop playing sports.  Abnormal murmurs. These types of murmurs can occur in children and adults. Abnormal murmurs may be a sign of a more serious heart condition, such as a heart defect present at birth (congenital defect) or heart valve disease.  What are the causes? This condition is caused by heart valves that are not working properly. In children, abnormal heart murmurs are typically caused by congenital defects. In adults, abnormal murmurs are usually from heart valve problems caused by disease, infection, or aging. Three types of heart valve defects can cause a murmur:  Regurgitation. This is when blood leaks back through the valve in the wrong direction.  Mitral valve prolapse. This is when the mitral valve of the heart has a loose flap and does not close tightly.  Stenosis. This  is when a valve does not open enough and blocks blood flow.  This condition may also be caused by:  Pregnancy.  Fever.  Overactive thyroid gland.  Anemia.  Exercise.  Rapid growth spurts (in children).  What are the signs or symptoms? Innocent murmurs do not cause symptoms, and many people with abnormal murmurs may or may not have symptoms. If symptoms do develop, they may include:  Shortness of  breath.  Blue coloring of the skin, especially on the fingertips.  Chest pain.  Palpitations, or feeling a fluttering or skipped heartbeat.  Fainting.  Persistent cough.  Getting tired much faster than expected.  Swelling in the abdomen, feet, or ankles.  How is this diagnosed? This condition may be diagnosed during a routine physical or other exam. If your health care provider hears a murmur with a stethoscope, he or she will listen for:  Where the murmur is located in your heart.  How long the murmur lasts (duration).  When the murmur is heard during the heartbeat.  How loud the murmur is. This may help the health care provider figure out what is causing the murmur.  You may be referred to a heart specialist (cardiologist). You may also have other tests, including:  Electrocardiogram (ECG or EKG). This test measures the electrical activity of your heart.  Echocardiogram. This test uses high frequency sound waves to make pictures of your heart.  MRI or chest X-ray.  Cardiac catheterization. This test looks at blood flow through the heart.  For children and adults who have an abnormal heart murmur and want to stay active, it is important to complete testing, review test results, and receive recommendations from your health care provider. If heart disease is present, it may not be safe to play or be active. How is this treated? Heart murmurs themselves do not need treatment. In some cases, a heart murmur may go away on its own. If an underlying problem or disease is causing the murmur, you may need treatment. If treatment is needed, it will depend on the type and severity of the disease or heart problem causing the murmur. Treatment may include:  Medicine.  Surgery.  Dietary and lifestyle changes.  Follow these instructions at home:  Talk with your health care provider before participating in sports or other activities that require a lot of effort and energy (are  strenuous).  Learn as much as possible about your condition and any related diseases. Ask your health care provider if you may at risk for any medical emergencies.  Talk with your health care provider about what symptoms you should look out for.  It is up to you to get your test results. Ask your health care provider, or the department that is doing the test, when your results will be ready.  Keep all follow-up visits as told by your health care provider. This is important. Contact a health care provider if:  You feel light-headed.  You are frequently short of breath.  You feel more tired than usual.  You are having a hard time keeping up with normal activities or fitness routines.  You have swelling in your ankles or feet.  You have chest pain.  You notice that your heart often beats irregularly.  You develop any new symptoms. Get help right away if:  You develop severe chest pain.  You are having trouble breathing.  You have fainting spells.  Your symptoms suddenly get worse. These symptoms may represent a serious problem that is  an emergency. Do not wait to see if the symptoms will go away. Get medical help right away. Call your local emergency services (911 in the [place] Do not drive yourself to the hospital. Summary  Normally, the heart valves open to let blood flow through or out of your heart, and then they shut to keep the blood from flowing backward.  Heart murmur is caused by heart valves that are not working properly.  You may need treatment if an underlying problem or disease is causing the heart murmur. Treatment may include medicine, surgery, or dietary and lifestyle changes.  Talk with your health care provider before participating in sports or other activities that require a lot of effort and energy (are strenuous).  Talk with your health care provider about what symptoms you should watch out for. This information is not intended to replace advice given  to you by your health care provider. Make sure you discuss any questions you have with your health care provider. Document Released: 09/09/2004 Document Revised: 07/21/2016 Document Reviewed: 07/21/2016 Elsevier Interactive Patient Education  2017 [employer] Sinusitis, Adult Sinusitis is soreness and inflammation of your sinuses. Sinuses are hollow spaces in the bones around your face. They are located:  Around your eyes.  In the middle of your forehead.  Behind your nose.  In your cheekbones.  Your sinuses and nasal passages are lined with a stringy fluid (mucus). Mucus normally drains out of your sinuses. When your nasal tissues get inflamed or swollen, the mucus can get trapped or blocked so air cannot flow through your sinuses. This lets bacteria, viruses, and funguses grow, and that leads to infection. Follow these instructions at home: Medicines  Take, use, or apply over-the-counter and prescription medicines only as told by your doctor. These may include nasal sprays.  If you were prescribed an antibiotic medicine, take it as told by your doctor. Do not stop taking the antibiotic even if you start to feel better. Hydrate and Humidify  Drink enough water to keep your pee (urine) clear or pale yellow.  Use a cool mist humidifier to keep the humidity level in your home above 50%.  Breathe in steam for 10-15 minutes, 3-4 times a day or as told by your doctor. You can do this in the bathroom while a hot shower is running.  Try not to spend time in cool or dry air. Rest  Rest as much as possible.  Sleep with your head raised (elevated).  Make sure to get enough sleep each night. General instructions  Put a warm, moist washcloth on your face 3-4 times a day or as told by your doctor. This will help with discomfort.  Wash your hands often with soap and water. If there is no soap and water, use hand sanitizer.  Do not smoke. Avoid being around people who are smoking  (secondhand smoke).  Keep all follow-up visits as told by your doctor. This is important. Contact a doctor if:  You have a fever.  Your symptoms get worse.  Your symptoms do not get better within 10 days. Get help right away if:  You have a very bad headache.  You cannot stop throwing up (vomiting).  You have pain or swelling around your face or eyes.  You have trouble seeing.  You feel confused.  Your neck is stiff.  You have trouble breathing. This information is not intended to replace advice given to you by your health care provider. Make sure you discuss any questions  you have with your health care provider. Document Released: 01/19/2008 Document Revised: 03/28/2016 Document Reviewed: 05/28/2015 Elsevier Interactive Patient Education  [employer].

## 2017-03-22 DIAGNOSIS — M25551 Pain in right hip: Secondary | ICD-10-CM | POA: Diagnosis not present

## 2017-03-22 DIAGNOSIS — L4059 Other psoriatic arthropathy: Secondary | ICD-10-CM | POA: Diagnosis not present

## 2017-03-22 DIAGNOSIS — L409 Psoriasis, unspecified: Secondary | ICD-10-CM | POA: Diagnosis not present

## 2017-03-24 DIAGNOSIS — L409 Psoriasis, unspecified: Secondary | ICD-10-CM | POA: Diagnosis not present

## 2017-04-11 DIAGNOSIS — E038 Other specified hypothyroidism: Secondary | ICD-10-CM | POA: Diagnosis not present

## 2017-04-11 DIAGNOSIS — E559 Vitamin D deficiency, unspecified: Secondary | ICD-10-CM | POA: Diagnosis not present

## 2017-04-11 DIAGNOSIS — E538 Deficiency of other specified B group vitamins: Secondary | ICD-10-CM | POA: Diagnosis not present

## 2017-04-11 DIAGNOSIS — E7800 Pure hypercholesterolemia, unspecified: Secondary | ICD-10-CM | POA: Diagnosis not present

## 2017-04-11 DIAGNOSIS — E063 Autoimmune thyroiditis: Secondary | ICD-10-CM | POA: Diagnosis not present

## 2017-04-11 DIAGNOSIS — I10 Essential (primary) hypertension: Secondary | ICD-10-CM | POA: Diagnosis not present

## 2017-04-15 DIAGNOSIS — I10 Essential (primary) hypertension: Secondary | ICD-10-CM | POA: Diagnosis not present

## 2017-04-15 DIAGNOSIS — E038 Other specified hypothyroidism: Secondary | ICD-10-CM | POA: Diagnosis not present

## 2017-04-15 DIAGNOSIS — E7800 Pure hypercholesterolemia, unspecified: Secondary | ICD-10-CM | POA: Diagnosis not present

## 2017-04-15 DIAGNOSIS — E063 Autoimmune thyroiditis: Secondary | ICD-10-CM | POA: Diagnosis not present

## 2017-04-17 DIAGNOSIS — J069 Acute upper respiratory infection, unspecified: Secondary | ICD-10-CM | POA: Diagnosis not present

## 2017-04-22 DIAGNOSIS — Z6841 Body Mass Index (BMI) 40.0 and over, adult: Secondary | ICD-10-CM | POA: Diagnosis not present

## 2017-04-22 DIAGNOSIS — L4059 Other psoriatic arthropathy: Secondary | ICD-10-CM | POA: Diagnosis not present

## 2017-04-22 DIAGNOSIS — L409 Psoriasis, unspecified: Secondary | ICD-10-CM | POA: Diagnosis not present

## 2017-04-22 DIAGNOSIS — M25551 Pain in right hip: Secondary | ICD-10-CM | POA: Diagnosis not present

## 2017-06-30 DIAGNOSIS — L409 Psoriasis, unspecified: Secondary | ICD-10-CM | POA: Diagnosis not present

## 2017-06-30 DIAGNOSIS — L4059 Other psoriatic arthropathy: Secondary | ICD-10-CM | POA: Diagnosis not present

## 2017-06-30 DIAGNOSIS — M25551 Pain in right hip: Secondary | ICD-10-CM | POA: Diagnosis not present

## 2017-07-11 DIAGNOSIS — Z1231 Encounter for screening mammogram for malignant neoplasm of breast: Secondary | ICD-10-CM | POA: Diagnosis not present

## 2017-08-19 DIAGNOSIS — H35373 Puckering of macula, bilateral: Secondary | ICD-10-CM | POA: Diagnosis not present

## 2017-08-19 DIAGNOSIS — H2511 Age-related nuclear cataract, right eye: Secondary | ICD-10-CM | POA: Diagnosis not present

## 2017-08-31 DIAGNOSIS — D649 Anemia, unspecified: Secondary | ICD-10-CM | POA: Diagnosis not present

## 2017-08-31 DIAGNOSIS — J4520 Mild intermittent asthma, uncomplicated: Secondary | ICD-10-CM | POA: Diagnosis not present

## 2017-08-31 DIAGNOSIS — E538 Deficiency of other specified B group vitamins: Secondary | ICD-10-CM | POA: Diagnosis not present

## 2017-08-31 DIAGNOSIS — I10 Essential (primary) hypertension: Secondary | ICD-10-CM | POA: Diagnosis not present

## 2017-08-31 DIAGNOSIS — R350 Frequency of micturition: Secondary | ICD-10-CM | POA: Diagnosis not present

## 2017-09-30 DIAGNOSIS — H903 Sensorineural hearing loss, bilateral: Secondary | ICD-10-CM | POA: Diagnosis not present

## 2017-10-06 DIAGNOSIS — L409 Psoriasis, unspecified: Secondary | ICD-10-CM | POA: Diagnosis not present

## 2017-10-06 DIAGNOSIS — L4059 Other psoriatic arthropathy: Secondary | ICD-10-CM | POA: Diagnosis not present

## 2017-10-06 DIAGNOSIS — M25551 Pain in right hip: Secondary | ICD-10-CM | POA: Diagnosis not present

## 2017-10-10 DIAGNOSIS — I10 Essential (primary) hypertension: Secondary | ICD-10-CM | POA: Diagnosis not present

## 2017-10-10 DIAGNOSIS — E7800 Pure hypercholesterolemia, unspecified: Secondary | ICD-10-CM | POA: Diagnosis not present

## 2017-10-10 DIAGNOSIS — E038 Other specified hypothyroidism: Secondary | ICD-10-CM | POA: Diagnosis not present

## 2017-10-10 DIAGNOSIS — E063 Autoimmune thyroiditis: Secondary | ICD-10-CM | POA: Diagnosis not present

## 2017-10-14 DIAGNOSIS — E038 Other specified hypothyroidism: Secondary | ICD-10-CM | POA: Diagnosis not present

## 2017-10-14 DIAGNOSIS — E063 Autoimmune thyroiditis: Secondary | ICD-10-CM | POA: Diagnosis not present

## 2017-10-14 DIAGNOSIS — I10 Essential (primary) hypertension: Secondary | ICD-10-CM | POA: Diagnosis not present

## 2017-10-14 DIAGNOSIS — E7800 Pure hypercholesterolemia, unspecified: Secondary | ICD-10-CM | POA: Diagnosis not present

## 2017-11-25 ENCOUNTER — Ambulatory Visit: Payer: Self-pay | Admitting: Adult Health

## 2017-11-25 ENCOUNTER — Encounter: Payer: Self-pay | Admitting: Adult Health

## 2017-11-25 VITALS — BP 132/64 | HR 69 | Temp 98.9°F | Resp 16 | Ht 68.0 in | Wt 282.0 lb

## 2017-11-25 DIAGNOSIS — N390 Urinary tract infection, site not specified: Secondary | ICD-10-CM

## 2017-11-25 DIAGNOSIS — J40 Bronchitis, not specified as acute or chronic: Secondary | ICD-10-CM

## 2017-11-25 DIAGNOSIS — R319 Hematuria, unspecified: Secondary | ICD-10-CM

## 2017-11-25 DIAGNOSIS — N399 Disorder of urinary system, unspecified: Secondary | ICD-10-CM

## 2017-11-25 LAB — POCT URINALYSIS DIPSTICK
APPEARANCE: NORMAL
BILIRUBIN UA: NEGATIVE
Glucose, UA: NEGATIVE
Ketones, UA: NEGATIVE
PH UA: 6 (ref 5.0–8.0)
PROTEIN UA: NEGATIVE
Spec Grav, UA: 1.01 (ref 1.010–1.025)
Urobilinogen, UA: 0.2 E.U./dL

## 2017-11-25 MED ORDER — ALBUTEROL SULFATE HFA 108 (90 BASE) MCG/ACT IN AERS: 2.0000 | INHALATION_SPRAY | Freq: Four times a day (QID) | RESPIRATORY_TRACT | 0 refills | Status: AC | PRN

## 2017-11-25 MED ORDER — PREDNISONE 10 MG (21) PO TBPK: ORAL_TABLET | ORAL | 0 refills | Status: DC

## 2017-11-25 MED ORDER — DOXYCYCLINE HYCLATE 100 MG PO TABS: 100.0000 mg | ORAL_TABLET | Freq: Two times a day (BID) | ORAL | 0 refills | Status: DC

## 2017-11-25 NOTE — Patient Instructions (Signed)
Acute Bronchitis, Adult Acute bronchitis is when air tubes (bronchi) in the lungs suddenly get swollen. The condition can make it hard to breathe. It can also cause these symptoms:  A cough.  Coughing up clear, yellow, or green mucus.  Wheezing.  Chest congestion.  Shortness of breath.  A fever.  Body aches.  Chills.  A sore throat.  Follow these instructions at home: Medicines  Take over-the-counter and prescription medicines only as told by your doctor.  If you were prescribed an antibiotic medicine, take it as told by your doctor. Do not stop taking the antibiotic even if you start to feel better. General instructions  Rest.  Drink enough fluids to keep your pee (urine) clear or pale yellow.  Avoid smoking and secondhand smoke. If you smoke and you need help quitting, ask your doctor. Quitting will help your lungs heal faster.  Use an inhaler, cool mist vaporizer, or humidifier as told by your doctor.  Keep all follow-up visits as told by your doctor. This is important. How is this prevented? To lower your risk of getting this condition again:  Wash your hands often with soap and water. If you cannot use soap and water, use hand sanitizer.  Avoid contact with people who have cold symptoms.  Try not to touch your hands to your mouth, nose, or eyes.  Make sure to get the flu shot every year.  Contact a doctor if:  Your symptoms do not get better in 2 weeks. Get help right away if:  You cough up blood.  You have chest pain.  You have very bad shortness of breath.  You become dehydrated.  You faint (pass out) or keep feeling like you are going to pass out.  You keep throwing up (vomiting).  You have a very bad headache.  Your fever or chills gets worse. This information is not intended to replace advice given to you by your health care provider. Make sure you discuss any questions you have with your health care provider. Document Released:  01/19/2008 Document Revised: 03/10/2016 Document Reviewed: 01/21/2016 Elsevier Interactive Patient Education  2018 [employer]. Urinary Tract Infection, Adult A urinary tract infection (UTI) is an infection of any part of the urinary tract. The urinary tract includes the:  Kidneys.  Ureters.  Bladder.  Urethra.  These organs make, store, and get rid of pee (urine) in the body. Follow these instructions at home:  Take over-the-counter and prescription medicines only as told by your doctor.  If you were prescribed an antibiotic medicine, take it as told by your doctor. Do not stop taking the antibiotic even if you start to feel better.  Avoid the following drinks: ? Alcohol. ? Caffeine. ? Tea. ? Carbonated drinks.  Drink enough fluid to keep your pee clear or pale yellow.  Keep all follow-up visits as told by your doctor. This is important.  Make sure to: ? Empty your bladder often and completely. Do not to hold pee for long periods of time. ? Empty your bladder before and after sex. ? Wipe from front to back after a bowel movement if you are female. Use each tissue one time when you wipe. Contact a doctor if:  You have back pain.  You have a fever.  You feel sick to your stomach (nauseous).  You throw up (vomit).  Your symptoms do not get better after 3 days.  Your symptoms go away and then come back. Get help right away if:  You have  very bad back pain.  You have very bad lower belly (abdominal) pain.  You are throwing up and cannot keep down any medicines or water. This information is not intended to replace advice given to you by your health care provider. Make sure you discuss any questions you have with your health care provider. Document Released: 01/19/2008 Document Revised: 01/08/2016 Document Reviewed: 06/23/2015 Elsevier Interactive Patient Education  [employer].

## 2017-11-25 NOTE — Progress Notes (Signed)
Urine sent for culture. Will need repeat to verify no blood or bacteria remain - 3 to 5 days after finishing antibiotic with office follow up for results( same day).

## 2017-11-25 NOTE — Progress Notes (Signed)
Subjective:     Patient ID: Chelsey Martin, female   DOB: 1956/02/14, 62 y.o.   MRN: [id number]  HPI Patient is a 62 year old female in no acute distress who comes to the clinic with complaints of  Chest congestion, itchy ears x 1 week. Has not taken her Claritin this week as her blood pressure medication was changed and she thought this might affect. She is not taking Claritin D or any decongestant.   She reports she took Robitussin this morning helps cough some.   She reports urinary symptoms burning, frequency x 4 days.   Blood pressure 132/64, pulse 69, temperature 98.9 F (37.2 C), resp. rate 16, height 5\' 8"  (1.727 m), weight 282 lb (127.9 kg), SpO2 97 %. Allergies  Allergen Reactions  . Peanut-Containing Drug Products     REACTION: hives/itching  . Peanut Oil Other (See Comments)    Unlisted, mild     Review of Systems  Constitutional: Negative for activity change, appetite change, chills, diaphoresis, fatigue, fever and unexpected weight change.  HENT: Positive for congestion and postnasal drip. Negative for ear discharge and ear pain.   Eyes: Negative.   Respiratory: Positive for cough and wheezing (patient reports last night- she has had a inhaler in past when sick - it is expired - she requests refill. ). Negative for apnea, choking, chest tightness, shortness of breath and stridor.   Cardiovascular: Negative for chest pain, palpitations and leg swelling.  Gastrointestinal: Negative.   Endocrine: Negative.   Genitourinary: Positive for frequency and urgency. Negative for decreased urine volume, difficulty urinating, dyspareunia, dysuria, enuresis, flank pain, genital sores, hematuria, menstrual problem (Hysterectomy 2006 ), pelvic pain, vaginal bleeding, vaginal discharge and vaginal pain.  Musculoskeletal: Negative.   Allergic/Immunologic: Positive for environmental allergies and food allergies. Negative for immunocompromised state.        -- Peanut-Containing Drug Products     --  REACTION: hives/itching  -- Peanut Oil -- Other (See Comments)   --  Unlisted, mild   Neurological: Negative.   Hematological: Negative.   Psychiatric/Behavioral: Negative.        Objective:   Physical Exam  Constitutional: She is oriented to person, place, and time. She appears well-developed and well-nourished. No distress.  HENT:  Head: Normocephalic and atraumatic.  Right Ear: External ear normal. Tympanic membrane is not erythematous. A middle ear effusion is present.  Left Ear: External ear normal. Tympanic membrane is not erythematous. A middle ear effusion is present.  Nose: Nose normal.  Mouth/Throat: Oropharynx is clear and moist. No oropharyngeal exudate.  Bilateral ear canals mild pink discoloration- she admits to cleaning ears with Q-tips and denies any pain or pain with tragus pull.   Eyes: Pupils are equal, round, and reactive to light. Conjunctivae and EOM are normal. Right eye exhibits no discharge. Left eye exhibits no discharge. No scleral icterus.  Neck: Normal range of motion. Neck supple. No JVD present. No tracheal deviation present.  Cardiovascular: Normal rate, regular rhythm, normal heart sounds and intact distal pulses. Exam reveals no gallop and no friction rub.  No murmur heard. Pulmonary/Chest: Effort normal and breath sounds normal. No stridor. No respiratory distress. She has no wheezes. She has no rales. She exhibits no tenderness.  Bronchial congestion and cough noted in room by ear .   No adventitious lung sounds auscultated in room.   Abdominal: Soft. Bowel sounds are normal. She exhibits no distension and no mass. There is no tenderness. There is no rebound,  no guarding and no CVA tenderness.  Musculoskeletal: Normal range of motion.  Lymphadenopathy:    She has no cervical adenopathy.  Neurological: She is alert and oriented to person, place, and time. She displays normal reflexes. No cranial nerve deficit. She exhibits normal muscle tone.  Coordination normal.  Skin: Skin is warm and dry. No rash noted. She is not diaphoretic. No erythema. No pallor.  Psychiatric: She has a normal mood and affect. Her behavior is normal. Judgment and thought content normal.  Vitals reviewed.      Assessment:     Bronchitis  Urinary tract infection with hematuria, site unspecified  Hematuria, unspecified type  Urinary problem in female - Plan: POCT urinalysis dipstick, Urine Culture, POCT urinalysis dipstick    Results for orders placed or performed in visit on 11/25/17 (from the past 24 hour(s))  POCT urinalysis dipstick     Status: Abnormal   Collection Time: 11/25/17 11:10 AM  Result Value Ref Range   Color, UA yellow    Clarity, UA clear    Glucose, UA negative    Bilirubin, UA negative    Ketones, UA negative    Spec Grav, UA 1.010 1.010 - 1.025   Blood, UA trace    pH, UA 6.0 5.0 - 8.0   Protein, UA negative    Urobilinogen, UA 0.2 0.2 or 1.0 E.U./dL   Nitrite, UA     Leukocytes, UA Trace (A) Negative   Appearance normal    Odor none    Trace leukocytes and trace blood on POCT dipstick. Urine sent for culture.      Plan:         Orders Placed This Encounter  Procedures  . Urine Culture  . POCT urinalysis dipstick  . POCT urinalysis dipstick    Standing Status:   Future    Standing Expiration Date:   01/25/2018    Scheduling Instructions:     To rule out hematuria/ leukocytes  follow up to office visit 11/25/16.   Meds ordered this encounter  Medications  . predniSONE (STERAPRED UNI-PAK 21 TAB) 10 MG (21) TBPK tablet    Sig: By mouth Take 6 tablets on day 1, Take 5 tablets day 2 Take 4 tablets day 3 Take 3 tablets day 4 Take 2 tablets day five 5 Take 1 tablet day    Dispense:  21 tablet    Refill:  0  . albuterol (PROVENTIL HFA;VENTOLIN HFA) 108 (90 Base) MCG/ACT inhaler    Sig: Inhale 2 puffs into the lungs every 6 (six) hours as needed for wheezing or shortness of breath.    Dispense:  1 Inhaler     Refill:  0  . doxycycline (VIBRA-TABS) 100 MG tablet    Sig: Take 1 tablet (100 mg total) by mouth 2 (two) times daily.    Dispense:  20 tablet    Refill:  0   She is aware of need for repeat  POCT urine for resolution of bacteria and hematuria and need for follow up visit with is.   Discussed need for regular follow up with PCP.  Advised patient call the office or your primary care doctor for an appointment if no improvement within 72 hours or if any symptoms change or worsen at any time  Advised ER or urgent Care if after hours or on weekend. Call 911 for emergency symptoms at any time.Patinet verbalized understanding of all instructions given/reviewed and treatment plan and has no further questions or concerns at  this time.    Patient verbalized understanding of all instructions given and denies any further questions at this time.

## 2017-11-27 LAB — URINE CULTURE

## 2017-11-27 NOTE — Progress Notes (Signed)
Chelsey Martin, Please call patient and let her know urine culture did not show sufficient bacteria count, she should continue the antibiotic for her chest congestion. Return to the office/ primary care if no improvement.   [name] only

## 2017-12-15 DIAGNOSIS — L409 Psoriasis, unspecified: Secondary | ICD-10-CM | POA: Diagnosis not present

## 2017-12-15 DIAGNOSIS — L4059 Other psoriatic arthropathy: Secondary | ICD-10-CM | POA: Diagnosis not present

## 2017-12-15 DIAGNOSIS — M25551 Pain in right hip: Secondary | ICD-10-CM | POA: Diagnosis not present

## 2018-02-20 ENCOUNTER — Encounter (HOSPITAL_COMMUNITY): Payer: Self-pay | Admitting: Emergency Medicine

## 2018-02-20 ENCOUNTER — Emergency Department (HOSPITAL_COMMUNITY)
Admission: EM | Admit: 2018-02-20 | Discharge: 2018-02-20 | Disposition: A | Discharge status: Home / Self Care | Payer: BLUE CROSS/BLUE SHIELD | Attending: Emergency Medicine | Admitting: Emergency Medicine

## 2018-02-20 DIAGNOSIS — I83811 Varicose veins of right lower extremities with pain: Secondary | ICD-10-CM | POA: Diagnosis not present

## 2018-02-20 DIAGNOSIS — Z5321 Procedure and treatment not carried out due to patient leaving prior to being seen by health care provider: Secondary | ICD-10-CM | POA: Diagnosis not present

## 2018-02-20 NOTE — ED Triage Notes (Signed)
Pt presents with bleeding varicose vein to RLE; states he leg itched and she scratched causing bleeding; bleeding controlled now; denies blood thinners

## 2018-02-20 NOTE — ED Notes (Signed)
Pt states LE is no longer bleeding after being dressed by triage nurse, pt states she will return if bleeding reoccurs otherwise will follow up with her PCP

## 2018-04-18 DIAGNOSIS — E559 Vitamin D deficiency, unspecified: Secondary | ICD-10-CM | POA: Diagnosis not present

## 2018-04-18 DIAGNOSIS — E7800 Pure hypercholesterolemia, unspecified: Secondary | ICD-10-CM | POA: Diagnosis not present

## 2018-04-18 DIAGNOSIS — E038 Other specified hypothyroidism: Secondary | ICD-10-CM | POA: Diagnosis not present

## 2018-04-18 DIAGNOSIS — E063 Autoimmune thyroiditis: Secondary | ICD-10-CM | POA: Diagnosis not present

## 2018-04-18 DIAGNOSIS — E538 Deficiency of other specified B group vitamins: Secondary | ICD-10-CM | POA: Diagnosis not present

## 2018-04-18 DIAGNOSIS — I10 Essential (primary) hypertension: Secondary | ICD-10-CM | POA: Diagnosis not present

## 2018-04-21 DIAGNOSIS — I10 Essential (primary) hypertension: Secondary | ICD-10-CM | POA: Diagnosis not present

## 2018-04-21 DIAGNOSIS — E7800 Pure hypercholesterolemia, unspecified: Secondary | ICD-10-CM | POA: Diagnosis not present

## 2018-04-21 DIAGNOSIS — E063 Autoimmune thyroiditis: Secondary | ICD-10-CM | POA: Diagnosis not present

## 2018-04-21 DIAGNOSIS — E038 Other specified hypothyroidism: Secondary | ICD-10-CM | POA: Diagnosis not present

## 2018-05-09 DIAGNOSIS — M25551 Pain in right hip: Secondary | ICD-10-CM | POA: Diagnosis not present

## 2018-05-09 DIAGNOSIS — L409 Psoriasis, unspecified: Secondary | ICD-10-CM | POA: Diagnosis not present

## 2018-05-09 DIAGNOSIS — L4059 Other psoriatic arthropathy: Secondary | ICD-10-CM | POA: Diagnosis not present

## 2018-06-08 DIAGNOSIS — Z1211 Encounter for screening for malignant neoplasm of colon: Secondary | ICD-10-CM | POA: Diagnosis not present

## 2018-06-08 DIAGNOSIS — I10 Essential (primary) hypertension: Secondary | ICD-10-CM | POA: Diagnosis not present

## 2018-06-08 DIAGNOSIS — Z0000 Encounter for general adult medical examination without abnormal findings: Secondary | ICD-10-CM | POA: Diagnosis not present

## 2018-06-08 DIAGNOSIS — R195 Other fecal abnormalities: Secondary | ICD-10-CM | POA: Diagnosis not present

## 2018-06-08 DIAGNOSIS — J45909 Unspecified asthma, uncomplicated: Secondary | ICD-10-CM | POA: Diagnosis not present

## 2018-06-11 DIAGNOSIS — Z1211 Encounter for screening for malignant neoplasm of colon: Secondary | ICD-10-CM | POA: Diagnosis not present

## 2018-07-17 DIAGNOSIS — Z1231 Encounter for screening mammogram for malignant neoplasm of breast: Secondary | ICD-10-CM | POA: Diagnosis not present

## 2018-07-17 DIAGNOSIS — Z803 Family history of malignant neoplasm of breast: Secondary | ICD-10-CM | POA: Diagnosis not present

## 2018-10-09 DIAGNOSIS — E063 Autoimmune thyroiditis: Secondary | ICD-10-CM | POA: Diagnosis not present

## 2018-10-09 DIAGNOSIS — E7800 Pure hypercholesterolemia, unspecified: Secondary | ICD-10-CM | POA: Diagnosis not present

## 2018-10-09 DIAGNOSIS — E038 Other specified hypothyroidism: Secondary | ICD-10-CM | POA: Diagnosis not present

## 2018-10-09 DIAGNOSIS — I10 Essential (primary) hypertension: Secondary | ICD-10-CM | POA: Diagnosis not present

## 2018-10-19 DIAGNOSIS — E7800 Pure hypercholesterolemia, unspecified: Secondary | ICD-10-CM | POA: Diagnosis not present

## 2018-10-19 DIAGNOSIS — I10 Essential (primary) hypertension: Secondary | ICD-10-CM | POA: Diagnosis not present

## 2018-10-19 DIAGNOSIS — E063 Autoimmune thyroiditis: Secondary | ICD-10-CM | POA: Diagnosis not present

## 2018-10-19 DIAGNOSIS — E038 Other specified hypothyroidism: Secondary | ICD-10-CM | POA: Diagnosis not present

## 2018-11-10 DIAGNOSIS — L4059 Other psoriatic arthropathy: Secondary | ICD-10-CM | POA: Diagnosis not present

## 2018-11-10 DIAGNOSIS — L409 Psoriasis, unspecified: Secondary | ICD-10-CM | POA: Diagnosis not present

## 2018-11-10 DIAGNOSIS — M25551 Pain in right hip: Secondary | ICD-10-CM | POA: Diagnosis not present

## 2018-12-06 DIAGNOSIS — F419 Anxiety disorder, unspecified: Secondary | ICD-10-CM | POA: Diagnosis not present

## 2018-12-06 DIAGNOSIS — I10 Essential (primary) hypertension: Secondary | ICD-10-CM | POA: Diagnosis not present

## 2018-12-06 DIAGNOSIS — J4520 Mild intermittent asthma, uncomplicated: Secondary | ICD-10-CM | POA: Diagnosis not present

## 2018-12-06 DIAGNOSIS — J309 Allergic rhinitis, unspecified: Secondary | ICD-10-CM | POA: Diagnosis not present

## 2019-04-19 DIAGNOSIS — E559 Vitamin D deficiency, unspecified: Secondary | ICD-10-CM | POA: Diagnosis not present

## 2019-04-19 DIAGNOSIS — E038 Other specified hypothyroidism: Secondary | ICD-10-CM | POA: Diagnosis not present

## 2019-04-19 DIAGNOSIS — E063 Autoimmune thyroiditis: Secondary | ICD-10-CM | POA: Diagnosis not present

## 2019-04-19 DIAGNOSIS — E538 Deficiency of other specified B group vitamins: Secondary | ICD-10-CM | POA: Diagnosis not present

## 2019-04-19 DIAGNOSIS — E7800 Pure hypercholesterolemia, unspecified: Secondary | ICD-10-CM | POA: Diagnosis not present

## 2019-04-19 DIAGNOSIS — I10 Essential (primary) hypertension: Secondary | ICD-10-CM | POA: Diagnosis not present

## 2019-04-24 DIAGNOSIS — E063 Autoimmune thyroiditis: Secondary | ICD-10-CM | POA: Diagnosis not present

## 2019-04-24 DIAGNOSIS — I10 Essential (primary) hypertension: Secondary | ICD-10-CM | POA: Diagnosis not present

## 2019-04-24 DIAGNOSIS — E038 Other specified hypothyroidism: Secondary | ICD-10-CM | POA: Diagnosis not present

## 2019-05-18 DIAGNOSIS — L409 Psoriasis, unspecified: Secondary | ICD-10-CM | POA: Diagnosis not present

## 2019-05-18 DIAGNOSIS — M25551 Pain in right hip: Secondary | ICD-10-CM | POA: Diagnosis not present

## 2019-05-18 DIAGNOSIS — L4059 Other psoriatic arthropathy: Secondary | ICD-10-CM | POA: Diagnosis not present

## 2019-07-02 DIAGNOSIS — Z0000 Encounter for general adult medical examination without abnormal findings: Secondary | ICD-10-CM | POA: Diagnosis not present

## 2019-07-16 DIAGNOSIS — E063 Autoimmune thyroiditis: Secondary | ICD-10-CM | POA: Diagnosis not present

## 2019-07-16 DIAGNOSIS — E538 Deficiency of other specified B group vitamins: Secondary | ICD-10-CM | POA: Diagnosis not present

## 2019-07-16 DIAGNOSIS — Z13220 Encounter for screening for lipoid disorders: Secondary | ICD-10-CM | POA: Diagnosis not present

## 2019-07-16 DIAGNOSIS — I10 Essential (primary) hypertension: Secondary | ICD-10-CM | POA: Diagnosis not present

## 2019-07-17 DIAGNOSIS — Z0000 Encounter for general adult medical examination without abnormal findings: Secondary | ICD-10-CM | POA: Diagnosis not present

## 2019-07-26 ENCOUNTER — Other Ambulatory Visit: Payer: Self-pay

## 2019-07-26 DIAGNOSIS — Z20822 Contact with and (suspected) exposure to covid-19: Secondary | ICD-10-CM

## 2019-07-27 LAB — NOVEL CORONAVIRUS, NAA: SARS-CoV-2, NAA: NOT DETECTED

## 2019-10-18 DIAGNOSIS — E063 Autoimmune thyroiditis: Secondary | ICD-10-CM | POA: Diagnosis not present

## 2019-10-18 DIAGNOSIS — E559 Vitamin D deficiency, unspecified: Secondary | ICD-10-CM | POA: Diagnosis not present

## 2019-10-18 DIAGNOSIS — E038 Other specified hypothyroidism: Secondary | ICD-10-CM | POA: Diagnosis not present

## 2019-10-18 DIAGNOSIS — I10 Essential (primary) hypertension: Secondary | ICD-10-CM | POA: Diagnosis not present

## 2019-10-18 DIAGNOSIS — E538 Deficiency of other specified B group vitamins: Secondary | ICD-10-CM | POA: Diagnosis not present

## 2019-10-18 DIAGNOSIS — E876 Hypokalemia: Secondary | ICD-10-CM | POA: Diagnosis not present

## 2019-10-18 DIAGNOSIS — E7800 Pure hypercholesterolemia, unspecified: Secondary | ICD-10-CM | POA: Diagnosis not present

## 2019-10-22 DIAGNOSIS — E038 Other specified hypothyroidism: Secondary | ICD-10-CM | POA: Diagnosis not present

## 2019-10-22 DIAGNOSIS — E7800 Pure hypercholesterolemia, unspecified: Secondary | ICD-10-CM | POA: Diagnosis not present

## 2019-10-22 DIAGNOSIS — I10 Essential (primary) hypertension: Secondary | ICD-10-CM | POA: Diagnosis not present

## 2019-10-22 DIAGNOSIS — E063 Autoimmune thyroiditis: Secondary | ICD-10-CM | POA: Diagnosis not present

## 2019-11-23 DIAGNOSIS — L409 Psoriasis, unspecified: Secondary | ICD-10-CM | POA: Diagnosis not present

## 2019-11-23 DIAGNOSIS — Z6841 Body Mass Index (BMI) 40.0 and over, adult: Secondary | ICD-10-CM | POA: Diagnosis not present

## 2019-11-23 DIAGNOSIS — L4059 Other psoriatic arthropathy: Secondary | ICD-10-CM | POA: Diagnosis not present

## 2019-11-29 DIAGNOSIS — Z6841 Body Mass Index (BMI) 40.0 and over, adult: Secondary | ICD-10-CM | POA: Diagnosis not present

## 2019-11-29 DIAGNOSIS — N6452 Nipple discharge: Secondary | ICD-10-CM | POA: Diagnosis not present

## 2019-11-29 DIAGNOSIS — Z01419 Encounter for gynecological examination (general) (routine) without abnormal findings: Secondary | ICD-10-CM | POA: Diagnosis not present

## 2019-12-13 DIAGNOSIS — R922 Inconclusive mammogram: Secondary | ICD-10-CM | POA: Diagnosis not present

## 2019-12-13 DIAGNOSIS — N6452 Nipple discharge: Secondary | ICD-10-CM | POA: Diagnosis not present

## 2019-12-24 ENCOUNTER — Other Ambulatory Visit: Payer: Self-pay | Admitting: Radiology

## 2019-12-24 DIAGNOSIS — N6042 Mammary duct ectasia of left breast: Secondary | ICD-10-CM | POA: Diagnosis not present

## 2019-12-24 DIAGNOSIS — D242 Benign neoplasm of left breast: Secondary | ICD-10-CM | POA: Diagnosis not present

## 2019-12-24 DIAGNOSIS — N6489 Other specified disorders of breast: Secondary | ICD-10-CM | POA: Diagnosis not present

## 2020-01-09 ENCOUNTER — Other Ambulatory Visit: Payer: Self-pay | Admitting: General Surgery

## 2020-01-09 DIAGNOSIS — N6452 Nipple discharge: Secondary | ICD-10-CM | POA: Diagnosis not present

## 2020-01-09 DIAGNOSIS — D242 Benign neoplasm of left breast: Secondary | ICD-10-CM

## 2020-01-09 DIAGNOSIS — N6320 Unspecified lump in the left breast, unspecified quadrant: Secondary | ICD-10-CM | POA: Diagnosis not present

## 2020-02-07 DIAGNOSIS — I10 Essential (primary) hypertension: Secondary | ICD-10-CM | POA: Diagnosis not present

## 2020-02-07 DIAGNOSIS — F419 Anxiety disorder, unspecified: Secondary | ICD-10-CM | POA: Diagnosis not present

## 2020-02-07 DIAGNOSIS — K219 Gastro-esophageal reflux disease without esophagitis: Secondary | ICD-10-CM | POA: Diagnosis not present

## 2020-02-08 NOTE — Progress Notes (Signed)
[id number] [place], [place] - [address] [place] [address] Phone: [phone] Fax: [phone]      Your procedure is scheduled on Thursday, July 1st.  Report to [facility] Main Entrance "A" at 5:30 A.M., and check in at the Admitting office.  Call this number if you have problems the morning of surgery:  [phone]  Call [phone] if you have any questions prior to your surgery date Monday-Friday 8am-4pm    Remember:  Do not eat after midnight the night before your surgery  You may drink clear liquids until 4:30 AM the morning of your surgery.   Clear liquids allowed are: Water, Non-Citrus Juices (without pulp), Carbonated Beverages, Clear Tea, Black Coffee Only, and Gatorade  Please finish the Ensure Pre-Surgery drink (3) hours before your surgery (4:30 AM). Drink all of the Ensure in one sitting, please do not sip.  Nothing else to drink after completing the Pre-Surgery Ensure.      Take these medicines the morning of surgery with A SIP OF WATER   Albuterol inhaler - if needed (bring with you on the day of surgery)  Alprazolam (Xanax) - if needed  Escitalopram (Lexapro)   Synthroid  As of today, STOP taking any Aspirin (unless otherwise instructed by your surgeon) and Aspirin containing products, Aleve, Naproxen, Ibuprofen, Motrin, Advil, Goody's, BC's, all herbal medications, fish oil, and all vitamins.                      Do not wear jewelry, make up, or nail polish            Do not wear lotions, powders, perfumes, or deodorant.            Do not shave 48 hours prior to surgery.              Do not bring valuables to the hospital.            [facility] is not responsible for any belongings or valuables.  Do NOT Smoke (Tobacco/Vapping) or drink Alcohol 24 hours prior to your procedure If you use a CPAP at night, you may bring all equipment for your overnight stay.   Contacts, glasses,  dentures or bridgework may not be worn into surgery.      For patients admitted to the hospital, discharge time will be determined by your treatment team.   Patients discharged the day of surgery will not be allowed to drive home, and someone needs to stay with them for 24 hours.    Special instructions:   [place]- Preparing For Surgery  Before surgery, you can play an important role. Because skin is not sterile, your skin needs to be as free of germs as possible. You can reduce the number of germs on your skin by washing with CHG (chlorahexidine gluconate) Soap before surgery.  CHG is an antiseptic cleaner which kills germs and bonds with the skin to continue killing germs even after washing.    Oral Hygiene is also important to reduce your risk of infection.  Remember - BRUSH YOUR TEETH THE MORNING OF SURGERY WITH YOUR REGULAR TOOTHPASTE  Please do not use if you have an allergy to CHG or antibacterial soaps. If your skin becomes reddened/irritated stop using the CHG.  Do not shave (including legs and underarms) for at least 48 hours prior to first CHG shower. It is OK to shave your face.  Please follow these instructions carefully.   1. Shower the NIGHT BEFORE SURGERY and the MORNING OF SURGERY with CHG Soap.   2. If you chose to wash your hair, wash your hair first as usual with your normal shampoo.  3. After you shampoo, rinse your hair and body thoroughly to remove the shampoo.  4. Use CHG as you would any other liquid soap. You can apply CHG directly to the skin and wash gently with a scrungie or a clean washcloth.   5. Apply the CHG Soap to your body ONLY FROM THE NECK DOWN.  Do not use on open wounds or open sores. Avoid contact with your eyes, ears, mouth and genitals (private parts). Wash Face and genitals (private parts)  with your normal soap.   6. Wash thoroughly, paying special attention to the area where your surgery will be performed.  7. Thoroughly rinse your  body with warm water from the neck down.  8. DO NOT shower/wash with your normal soap after using and rinsing off the CHG Soap.  9. Pat yourself dry with a CLEAN TOWEL.  10. Wear CLEAN PAJAMAS to bed the night before surgery, wear comfortable clothes the morning of surgery  11. Place CLEAN SHEETS on your bed the night of your first shower and DO NOT SLEEP WITH PETS.   Day of Surgery:   Do not apply any deodorants/lotions.  Please wear clean clothes to the hospital/surgery center.   Remember to brush your teeth WITH YOUR REGULAR TOOTHPASTE.   Please read over the following fact sheets that you were given.

## 2020-02-11 ENCOUNTER — Encounter (HOSPITAL_COMMUNITY)
Admission: RE | Admit: 2020-02-11 | Discharge: 2020-02-11 | Disposition: A | Discharge status: Home / Self Care | Payer: BC Managed Care – PPO | Source: Ambulatory Visit | Attending: General Surgery | Admitting: General Surgery

## 2020-02-11 ENCOUNTER — Other Ambulatory Visit (HOSPITAL_COMMUNITY)
Admission: RE | Admit: 2020-02-11 | Discharge: 2020-02-11 | Disposition: A | Discharge status: Home / Self Care | Payer: BC Managed Care – PPO | Source: Ambulatory Visit | Attending: General Surgery | Admitting: General Surgery

## 2020-02-11 ENCOUNTER — Encounter (HOSPITAL_COMMUNITY): Payer: Self-pay

## 2020-02-11 ENCOUNTER — Other Ambulatory Visit: Payer: Self-pay

## 2020-02-11 ENCOUNTER — Other Ambulatory Visit (HOSPITAL_COMMUNITY): Payer: BLUE CROSS/BLUE SHIELD

## 2020-02-11 DIAGNOSIS — Z79899 Other long term (current) drug therapy: Secondary | ICD-10-CM | POA: Insufficient documentation

## 2020-02-11 DIAGNOSIS — Z01818 Encounter for other preprocedural examination: Secondary | ICD-10-CM | POA: Insufficient documentation

## 2020-02-11 DIAGNOSIS — E039 Hypothyroidism, unspecified: Secondary | ICD-10-CM | POA: Insufficient documentation

## 2020-02-11 DIAGNOSIS — I10 Essential (primary) hypertension: Secondary | ICD-10-CM | POA: Diagnosis not present

## 2020-02-11 DIAGNOSIS — F419 Anxiety disorder, unspecified: Secondary | ICD-10-CM | POA: Insufficient documentation

## 2020-02-11 DIAGNOSIS — Z20822 Contact with and (suspected) exposure to covid-19: Secondary | ICD-10-CM | POA: Insufficient documentation

## 2020-02-11 DIAGNOSIS — Z7901 Long term (current) use of anticoagulants: Secondary | ICD-10-CM | POA: Insufficient documentation

## 2020-02-11 DIAGNOSIS — D242 Benign neoplasm of left breast: Secondary | ICD-10-CM | POA: Diagnosis not present

## 2020-02-11 HISTORY — DX: Anxiety disorder, unspecified: F41.9

## 2020-02-11 HISTORY — DX: Pneumonia, unspecified organism: J18.9

## 2020-02-11 HISTORY — DX: Other specified postprocedural states: R11.2

## 2020-02-11 HISTORY — DX: Malignant (primary) neoplasm, unspecified: C80.1

## 2020-02-11 HISTORY — DX: Other specified postprocedural states: Z98.890

## 2020-02-11 LAB — BASIC METABOLIC PANEL
Anion gap: 8 (ref 5–15)
BUN: 10 mg/dL (ref 8–23)
CO2: 29 mmol/L (ref 22–32)
Calcium: 9.1 mg/dL (ref 8.9–10.3)
Chloride: 103 mmol/L (ref 98–111)
Creatinine, Ser: 0.68 mg/dL (ref 0.44–1.00)
GFR calc Af Amer: >60 mL/min (ref >60)
GFR calc non Af Amer: >60 mL/min (ref >60)
Glucose, Bld: 98 mg/dL (ref 70–99)
Potassium: 3.9 mmol/L (ref 3.5–5.1)
Sodium: 140 mmol/L (ref 135–145)

## 2020-02-11 LAB — CBC
HCT: 37.3 % (ref 36.0–46.0)
Hemoglobin: 11.5 g/dL — ABNORMAL LOW (ref 12.0–15.0)
MCH: 25.7 pg — ABNORMAL LOW (ref 26.0–34.0)
MCHC: 30.8 g/dL (ref 30.0–36.0)
MCV: 83.4 fL (ref 80.0–100.0)
Platelets: 224 10*3/uL (ref 150–400)
RBC: 4.47 MIL/uL (ref 3.87–5.11)
RDW: 15.2 % (ref 11.5–15.5)
WBC: 7.7 10*3/uL (ref 4.0–10.5)
nRBC: 0 % (ref 0.0–0.2)

## 2020-02-11 LAB — SARS CORONAVIRUS 2 (TAT 6-24 HRS): SARS Coronavirus 2: NEGATIVE

## 2020-02-11 NOTE — Progress Notes (Signed)
PCP - Dr. Kelton Pillar Cardiologist - denies  PPM/ICD - N/A Device Orders -N/A  Rep Notified - N/A  Chest x-ray - N/A EKG - 02/11/20 Stress Test - denies ECHO - denies Cardiac Cath - denies  Sleep Study - chart states pt has OSA. Pt denies this or the use of a CPAP. States she has never had a sleep study. Notes from Dr. Chase Caller states pt has/had OSA.  CPAP - denies  Blood Thinner Instructions: N/A Aspirin Instructions:N/A  ERAS Protcol -Protocol initiated. Instructions given to pt. Instructed to cease liquids by 0430 the morning of surgery.  PRE-SURGERY Ensure or G2- Pt provided with (1) Pre-surgical Ensure.   COVID TEST- 02/11/20 after PAT appointment.    Anesthesia review: Yes, EKG review.   Patient denies shortness of breath, fever, cough and chest pain at PAT appointment   All instructions explained to the patient, with a verbal understanding of the material. Patient agrees to go over the instructions while at home for a better understanding. Patient also instructed to self quarantine after being tested for COVID-19. The opportunity to ask questions was provided.    Coronavirus Screening  Have you experienced the following symptoms:  Cough yes/no: No Fever (>100.44F)  yes/no: No Runny nose yes/no: No Sore throat yes/no: No Difficulty breathing/shortness of breath  yes/no: No  Have you or a family member traveled in the last 14 days and where? yes/no: No   If the patient indicates "YES" to the above questions, their PAT will be rescheduled to limit the exposure to others and, the surgeon will be notified. THE PATIENT WILL NEED TO BE ASYMPTOMATIC FOR 14 DAYS.   If the patient is not experiencing any of these symptoms, the PAT nurse will instruct them to NOT bring anyone with them to their appointment since they may have these symptoms or traveled as well.   Please remind your patients and families that hospital visitation restrictions are in effect and the  importance of the restrictions.

## 2020-02-12 NOTE — Anesthesia Preprocedure Evaluation (Addendum)
Anesthesia Evaluation  Patient identified by MRN, date of birth, ID band Patient awake    Reviewed: Allergy & Precautions, NPO status , Patient's Chart, lab work & pertinent test results  History of Anesthesia Complications (+) PONV  Airway Mallampati: II  TM Distance: >3 FB Neck ROM: Full    Dental  (+) Teeth Intact, Dental Advisory Given   Pulmonary asthma , sleep apnea ,    Pulmonary exam normal        Cardiovascular hypertension, Pt. on medications Normal cardiovascular exam     Neuro/Psych Anxiety    GI/Hepatic   Endo/Other  Hypothyroidism   Renal/GU      Musculoskeletal   Abdominal   Peds  Hematology   Anesthesia Other Findings   Reproductive/Obstetrics                            Anesthesia Physical Anesthesia Plan  ASA: III  Anesthesia Plan: General   Post-op Pain Management:    Induction: Intravenous  PONV Risk Score and Plan: 4 or greater and Ondansetron, Dexamethasone, Midazolam and Scopolamine patch - Pre-op  Airway Management Planned: LMA  Additional Equipment:   Intra-op Plan:   Post-operative Plan: Extubation in OR  Informed Consent: I have reviewed the patients History and Physical, chart, labs and discussed the procedure including the risks, benefits and alternatives for the proposed anesthesia with the patient or authorized representative who has indicated his/her understanding and acceptance.       Plan Discussed with: CRNA and Surgeon  Anesthesia Plan Comments: (PAT note written 02/12/2020 by Myra Gianotti, PA-C. )       Anesthesia Quick Evaluation

## 2020-02-12 NOTE — Progress Notes (Signed)
Anesthesia Chart Review:  Case: [id number] Date/Time: 02/14/20 0715   Procedure: LEFT BREAST RADIOACTIVE SEED GUIDED EXCISIONAL BREAST BIOPSY (Left Breast)   Anesthesia type: General   Pre-op diagnosis: LEFT BREAST PAPILLOMA   Location: [facility] OR ROOM 09 / [place]   Surgeons: Rolm Bookbinder, MD    [facility] 02/13/20 10:00 AM  DISCUSSION: She is a 64 year old female scheduled for the above procedure. Dx: left breast papilloma.  History includes never smoker, postoperative N/V, anemia, autoimmune hypothyroidism, HTN, asthma, anxiety, skin cancer (BCC, face, lesion excised), hysterectomy (with BSO 09/15/05). BMI is consistent with morbid obesity. She denied history of sleep study and diagnosis of OSA.  She denied shortness of breath, chest pain, cough, fever PAT RN visit.  02/11/2020 presurgical COVID-19 test negative.  Anesthesia team to evaluate on the day of surgery.   VS: BP 117/64   Pulse 64   Temp 36.9 C   Resp 18   Ht 5' 8" (1.727 m)   Wt 130.4 kg   SpO2 96%   BMI 43.70 kg/m    PROVIDERS: Kelton Pillar, MD is PCP  Althe[place], Darlis Loan, MD is endocrinologist ([place])   LABS: Labs reviewed: Acceptable for surgery. (all labs ordered are listed, but only abnormal results are displayed)  Labs Reviewed  CBC - Abnormal; Notable for the following components:      Result Value   Hemoglobin 11.5 (*)    MCH 25.7 (*)    All other components within normal limits  BASIC METABOLIC PANEL     EKG: 10/06/23: Normal sinus rhythm Left axis deviation Minimal voltage criteria for LVH, may be normal variant ( Cornell product ) Cannot rule out Anterior infarct , age undetermined Abnormal ECG No old tracing to compare Confirmed by Charolette Forward ([id number]) on 02/11/2020 5:02:16 PM   CV: She denied prior stress test, echocardiogram, cardiac cath.  Past Medical History:  Diagnosis Date  . Anemia   . Anxiety   . Asthma   . Cancer ([place])    basal cell skin cancer on face-surgically  removed 2014/2015.   Marland Kitchen Hemorrhoids   . Hypothyroidism   . Iron deficiency anemia   . Pneumonia    reports 3-4 years ago.   Marland Kitchen PONV (postoperative nausea and vomiting)   . Sleep apnea    pt denies. States she has never had a sleep study. Denies CPAP use.  Marland Kitchen Unspecified essential hypertension     Past Surgical History:  Procedure Laterality Date  . APPENDECTOMY    . BREAST BIOPSY    . HEEL SPUR SURGERY    . VAGINAL HYSTERECTOMY      MEDICATIONS: . albuterol (PROVENTIL HFA;VENTOLIN HFA) 108 (90 Base) MCG/ACT inhaler  . ALPRAZolam (XANAX) 0.25 MG tablet  . Cholecalciferol (VITAMIN D3) 2000 units capsule  . Cyanocobalamin 1000 MCG/ML KIT  . escitalopram (LEXAPRO) 10 MG tablet  . losartan-hydrochlorothiazide (HYZAAR) 50-12.5 MG tablet  . Misc Natural Products (OSTEO BI-FLEX ADV DOUBLE ST PO)  . potassium chloride SA (KLOR-CON) 20 MEQ tablet  . SYNTHROID 175 MCG tablet   No current facility-administered medications for this encounter.    Myra Gianotti, PA-C Surgical Short Stay/Anesthesiology [facility] Phone [phone] [facility] Phone [phone] 02/12/2020 9:47 AM

## 2020-02-13 DIAGNOSIS — R928 Other abnormal and inconclusive findings on diagnostic imaging of breast: Secondary | ICD-10-CM | POA: Diagnosis not present

## 2020-02-13 MED ORDER — DEXTROSE 5 % IV SOLN
3.0000 g | INTRAVENOUS | Status: AC
  Administered 2020-02-14: 3 g via INTRAVENOUS
  Filled 2020-02-13: qty 3

## 2020-02-14 ENCOUNTER — Ambulatory Visit (HOSPITAL_COMMUNITY)
Admission: RE | Admit: 2020-02-14 | Discharge: 2020-02-14 | Disposition: A | Discharge status: Home / Self Care | Payer: BC Managed Care – PPO | Attending: General Surgery | Admitting: General Surgery

## 2020-02-14 ENCOUNTER — Ambulatory Visit (HOSPITAL_COMMUNITY): Payer: BC Managed Care – PPO | Admitting: Anesthesiology

## 2020-02-14 ENCOUNTER — Encounter (HOSPITAL_COMMUNITY)
Admission: RE | Disposition: A | Discharge status: Home / Self Care | Payer: Self-pay | Source: Home / Self Care | Attending: General Surgery

## 2020-02-14 ENCOUNTER — Encounter (HOSPITAL_COMMUNITY): Payer: Self-pay | Admitting: General Surgery

## 2020-02-14 ENCOUNTER — Other Ambulatory Visit: Payer: Self-pay

## 2020-02-14 ENCOUNTER — Ambulatory Visit (HOSPITAL_COMMUNITY): Payer: BC Managed Care – PPO | Admitting: Vascular Surgery

## 2020-02-14 DIAGNOSIS — Z818 Family history of other mental and behavioral disorders: Secondary | ICD-10-CM | POA: Diagnosis not present

## 2020-02-14 DIAGNOSIS — J45909 Unspecified asthma, uncomplicated: Secondary | ICD-10-CM | POA: Diagnosis not present

## 2020-02-14 DIAGNOSIS — Z79899 Other long term (current) drug therapy: Secondary | ICD-10-CM | POA: Insufficient documentation

## 2020-02-14 DIAGNOSIS — F419 Anxiety disorder, unspecified: Secondary | ICD-10-CM | POA: Diagnosis not present

## 2020-02-14 DIAGNOSIS — Z803 Family history of malignant neoplasm of breast: Secondary | ICD-10-CM | POA: Insufficient documentation

## 2020-02-14 DIAGNOSIS — G4730 Sleep apnea, unspecified: Secondary | ICD-10-CM | POA: Diagnosis not present

## 2020-02-14 DIAGNOSIS — E039 Hypothyroidism, unspecified: Secondary | ICD-10-CM | POA: Insufficient documentation

## 2020-02-14 DIAGNOSIS — Z8261 Family history of arthritis: Secondary | ICD-10-CM | POA: Insufficient documentation

## 2020-02-14 DIAGNOSIS — Z90710 Acquired absence of both cervix and uterus: Secondary | ICD-10-CM | POA: Diagnosis not present

## 2020-02-14 DIAGNOSIS — Z8249 Family history of ischemic heart disease and other diseases of the circulatory system: Secondary | ICD-10-CM | POA: Insufficient documentation

## 2020-02-14 DIAGNOSIS — Z809 Family history of malignant neoplasm, unspecified: Secondary | ICD-10-CM | POA: Insufficient documentation

## 2020-02-14 DIAGNOSIS — M1990 Unspecified osteoarthritis, unspecified site: Secondary | ICD-10-CM | POA: Diagnosis not present

## 2020-02-14 DIAGNOSIS — N6452 Nipple discharge: Secondary | ICD-10-CM | POA: Diagnosis not present

## 2020-02-14 DIAGNOSIS — D242 Benign neoplasm of left breast: Secondary | ICD-10-CM | POA: Insufficient documentation

## 2020-02-14 DIAGNOSIS — N62 Hypertrophy of breast: Secondary | ICD-10-CM | POA: Insufficient documentation

## 2020-02-14 DIAGNOSIS — I10 Essential (primary) hypertension: Secondary | ICD-10-CM | POA: Insufficient documentation

## 2020-02-14 DIAGNOSIS — N6012 Diffuse cystic mastopathy of left breast: Secondary | ICD-10-CM | POA: Diagnosis not present

## 2020-02-14 DIAGNOSIS — R011 Cardiac murmur, unspecified: Secondary | ICD-10-CM | POA: Insufficient documentation

## 2020-02-14 DIAGNOSIS — N6092 Unspecified benign mammary dysplasia of left breast: Secondary | ICD-10-CM | POA: Diagnosis not present

## 2020-02-14 HISTORY — PX: RADIOACTIVE SEED GUIDED EXCISIONAL BREAST BIOPSY: SHX6490

## 2020-02-14 SURGERY — RADIOACTIVE SEED GUIDED BREAST BIOPSY
Anesthesia: General | Site: Breast | Laterality: Left

## 2020-02-14 MED ORDER — GABAPENTIN 100 MG PO CAPS
100.0000 mg | ORAL_CAPSULE | ORAL | Status: AC
  Administered 2020-02-14: 100 mg via ORAL
  Filled 2020-02-14: qty 1

## 2020-02-14 MED ORDER — SCOPOLAMINE 1 MG/3DAYS TD PT72
MEDICATED_PATCH | TRANSDERMAL | Status: AC
  Filled 2020-02-14: qty 1

## 2020-02-14 MED ORDER — PROPOFOL 10 MG/ML IV BOLUS
INTRAVENOUS | Status: DC | PRN
  Administered 2020-02-14: 160 mg via INTRAVENOUS

## 2020-02-14 MED ORDER — LACTATED RINGERS IV SOLN: INTRAVENOUS | Status: DC

## 2020-02-14 MED ORDER — SCOPOLAMINE 1 MG/3DAYS TD PT72
MEDICATED_PATCH | TRANSDERMAL | Status: DC | PRN
  Administered 2020-02-14: 1 via TRANSDERMAL

## 2020-02-14 MED ORDER — HYDROMORPHONE HCL 1 MG/ML IJ SOLN: 0.2500 mg | INTRAMUSCULAR | Status: DC | PRN

## 2020-02-14 MED ORDER — ONDANSETRON HCL 4 MG/2ML IJ SOLN
INTRAMUSCULAR | Status: AC
  Filled 2020-02-14: qty 2

## 2020-02-14 MED ORDER — ENSURE PRE-SURGERY PO LIQD: 296.0000 mL | Freq: Once | ORAL | Status: DC

## 2020-02-14 MED ORDER — PHENYLEPHRINE 40 MCG/ML (10ML) SYRINGE FOR IV PUSH (FOR BLOOD PRESSURE SUPPORT)
PREFILLED_SYRINGE | INTRAVENOUS | Status: AC
  Filled 2020-02-14: qty 10

## 2020-02-14 MED ORDER — CHLORHEXIDINE GLUCONATE 0.12 % MT SOLN
15.0000 mL | Freq: Once | OROMUCOSAL | Status: AC
  Administered 2020-02-14: 15 mL via OROMUCOSAL
  Filled 2020-02-14: qty 15

## 2020-02-14 MED ORDER — PROPOFOL 10 MG/ML IV BOLUS
INTRAVENOUS | Status: AC
  Filled 2020-02-14: qty 20

## 2020-02-14 MED ORDER — FENTANYL CITRATE (PF) 100 MCG/2ML IJ SOLN
INTRAMUSCULAR | Status: DC | PRN
  Administered 2020-02-14: 50 ug via INTRAVENOUS

## 2020-02-14 MED ORDER — 0.9 % SODIUM CHLORIDE (POUR BTL) OPTIME
TOPICAL | Status: DC | PRN
  Administered 2020-02-14: 1000 mL

## 2020-02-14 MED ORDER — ACETAMINOPHEN 325 MG PO TABS
650.0000 mg | ORAL_TABLET | ORAL | Status: DC | PRN
Start: 2020-02-14 — End: 2020-02-14

## 2020-02-14 MED ORDER — MIDAZOLAM HCL 5 MG/5ML IJ SOLN
INTRAMUSCULAR | Status: DC | PRN
  Administered 2020-02-14: 2 mg via INTRAVENOUS

## 2020-02-14 MED ORDER — MEPERIDINE HCL 25 MG/ML IJ SOLN: 6.2500 mg | INTRAMUSCULAR | Status: DC | PRN

## 2020-02-14 MED ORDER — ONDANSETRON HCL 4 MG/2ML IJ SOLN: 4.0000 mg | Freq: Once | INTRAMUSCULAR | Status: DC | PRN

## 2020-02-14 MED ORDER — MIDAZOLAM HCL 2 MG/2ML IJ SOLN
INTRAMUSCULAR | Status: AC
  Filled 2020-02-14: qty 2

## 2020-02-14 MED ORDER — ONDANSETRON HCL 4 MG/2ML IJ SOLN
INTRAMUSCULAR | Status: DC | PRN
  Administered 2020-02-14: 4 mg via INTRAVENOUS

## 2020-02-14 MED ORDER — OXYCODONE HCL 5 MG PO TABS: 5.0000 mg | ORAL_TABLET | ORAL | Status: DC | PRN

## 2020-02-14 MED ORDER — LIDOCAINE HCL (CARDIAC) PF 100 MG/5ML IV SOSY
PREFILLED_SYRINGE | INTRAVENOUS | Status: DC | PRN
  Administered 2020-02-14: 100 mg via INTRAVENOUS

## 2020-02-14 MED ORDER — FENTANYL CITRATE (PF) 250 MCG/5ML IJ SOLN
INTRAMUSCULAR | Status: AC
  Filled 2020-02-14: qty 5

## 2020-02-14 MED ORDER — DEXAMETHASONE SODIUM PHOSPHATE 10 MG/ML IJ SOLN
INTRAMUSCULAR | Status: AC
  Filled 2020-02-14: qty 1

## 2020-02-14 MED ORDER — PHENYLEPHRINE 40 MCG/ML (10ML) SYRINGE FOR IV PUSH (FOR BLOOD PRESSURE SUPPORT)
PREFILLED_SYRINGE | INTRAVENOUS | Status: DC | PRN
  Administered 2020-02-14: 80 ug via INTRAVENOUS

## 2020-02-14 MED ORDER — LIDOCAINE 2% (20 MG/ML) 5 ML SYRINGE
INTRAMUSCULAR | Status: AC
  Filled 2020-02-14: qty 5

## 2020-02-14 MED ORDER — ACETAMINOPHEN 650 MG RE SUPP: 650.0000 mg | RECTAL | Status: DC | PRN

## 2020-02-14 MED ORDER — SODIUM CHLORIDE 0.9 % IV SOLN
250.0000 mL | INTRAVENOUS | Status: DC | PRN
Start: 2020-02-14 — End: 2020-02-14

## 2020-02-14 MED ORDER — SODIUM CHLORIDE 0.9 % IV SOLN: INTRAVENOUS | Status: DC

## 2020-02-14 MED ORDER — SODIUM CHLORIDE 0.9% FLUSH: 3.0000 mL | INTRAVENOUS | Status: DC | PRN

## 2020-02-14 MED ORDER — DEXAMETHASONE SODIUM PHOSPHATE 10 MG/ML IJ SOLN
INTRAMUSCULAR | Status: DC | PRN
Start: 2020-02-14 — End: 2020-02-14
  Administered 2020-02-14: 10 mg via INTRAVENOUS

## 2020-02-14 MED ORDER — BUPIVACAINE HCL (PF) 0.25 % IJ SOLN
INTRAMUSCULAR | Status: AC
  Filled 2020-02-14: qty 30

## 2020-02-14 MED ORDER — ORAL CARE MOUTH RINSE: 15.0000 mL | Freq: Once | OROMUCOSAL | Status: AC

## 2020-02-14 MED ORDER — LACTATED RINGERS IV SOLN: INTRAVENOUS | Status: DC | PRN

## 2020-02-14 MED ORDER — ACETAMINOPHEN 500 MG PO TABS
1000.0000 mg | ORAL_TABLET | ORAL | Status: AC
  Administered 2020-02-14: 1000 mg via ORAL
  Filled 2020-02-14: qty 2

## 2020-02-14 MED ORDER — BUPIVACAINE-EPINEPHRINE 0.25% -1:200000 IJ SOLN
INTRAMUSCULAR | Status: DC | PRN
  Administered 2020-02-14: 10 mL

## 2020-02-14 SURGICAL SUPPLY — 48 items
ADH SKN CLS APL DERMABOND .7 (GAUZE/BANDAGES/DRESSINGS) ×1
APL PRP STRL LF DISP 70% ISPRP (MISCELLANEOUS) ×1
APPLIER CLIP 9.375 MED OPEN (MISCELLANEOUS)
APR CLP MED 9.3 20 MLT OPN (MISCELLANEOUS)
BINDER BREAST LRG (GAUZE/BANDAGES/DRESSINGS) IMPLANT
BINDER BREAST XLRG (GAUZE/BANDAGES/DRESSINGS) ×1 IMPLANT
CANISTER SUCT 3000ML PPV (MISCELLANEOUS) IMPLANT
CHLORAPREP W/TINT 26 (MISCELLANEOUS) ×2 IMPLANT
CLIP APPLIE 9.375 MED OPEN (MISCELLANEOUS) IMPLANT
CLSR STERI-STRIP ANTIMIC 1/2X4 (GAUZE/BANDAGES/DRESSINGS) ×1 IMPLANT
COVER PROBE W GEL 5X96 (DRAPES) ×2 IMPLANT
COVER SURGICAL LIGHT HANDLE (MISCELLANEOUS) ×2 IMPLANT
COVER WAND RF STERILE (DRAPES) ×2 IMPLANT
DERMABOND ADVANCED (GAUZE/BANDAGES/DRESSINGS) ×1
DERMABOND ADVANCED .7 DNX12 (GAUZE/BANDAGES/DRESSINGS) ×1 IMPLANT
DEVICE DUBIN SPECIMEN MAMMOGRA (MISCELLANEOUS) ×2 IMPLANT
DRAPE CHEST BREAST 15X10 FENES (DRAPES) ×2 IMPLANT
ELECT COATED BLADE 2.86 ST (ELECTRODE) ×2 IMPLANT
ELECT REM PT RETURN 9FT ADLT (ELECTROSURGICAL) ×2
ELECTRODE REM PT RTRN 9FT ADLT (ELECTROSURGICAL) ×1 IMPLANT
GLOVE BIO SURGEON STRL SZ7 (GLOVE) ×2 IMPLANT
GLOVE BIOGEL PI IND STRL 7.5 (GLOVE) ×1 IMPLANT
GLOVE BIOGEL PI INDICATOR 7.5 (GLOVE) ×1
GLOVE ECLIPSE 6.5 STRL STRAW (GLOVE) ×1 IMPLANT
GLOVE SURG SS PI 6.5 STRL IVOR (GLOVE) ×1 IMPLANT
GOWN STRL REUS W/ TWL LRG LVL3 (GOWN DISPOSABLE) ×2 IMPLANT
GOWN STRL REUS W/TWL LRG LVL3 (GOWN DISPOSABLE) ×4
HEMOSTAT ARISTA ABSORB 3G PWDR (HEMOSTASIS) IMPLANT
ILLUMINATOR WAVEGUIDE N/F (MISCELLANEOUS) IMPLANT
KIT BASIN OR (CUSTOM PROCEDURE TRAY) ×2 IMPLANT
KIT MARKER MARGIN INK (KITS) ×2 IMPLANT
LIGHT WAVEGUIDE WIDE FLAT (MISCELLANEOUS) IMPLANT
NDL HYPO 25GX1X1/2 BEV (NEEDLE) ×1 IMPLANT
NEEDLE HYPO 25GX1X1/2 BEV (NEEDLE) ×2 IMPLANT
NS IRRIG 1000ML POUR BTL (IV SOLUTION) IMPLANT
PACK GENERAL/GYN (CUSTOM PROCEDURE TRAY) ×2 IMPLANT
PENCIL SMOKE EVACUATOR (MISCELLANEOUS) ×1 IMPLANT
STRIP CLOSURE SKIN 1/2X4 (GAUZE/BANDAGES/DRESSINGS) ×2 IMPLANT
SUT MNCRL AB 4-0 PS2 18 (SUTURE) ×2 IMPLANT
SUT MON AB 5-0 P3 18 (SUTURE) ×1 IMPLANT
SUT SILK 2 0 SH (SUTURE) IMPLANT
SUT VIC AB 2-0 SH 27 (SUTURE) ×2
SUT VIC AB 2-0 SH 27XBRD (SUTURE) ×1 IMPLANT
SUT VIC AB 3-0 SH 27 (SUTURE) ×2
SUT VIC AB 3-0 SH 27X BRD (SUTURE) ×1 IMPLANT
SYR CONTROL 10ML LL (SYRINGE) ×2 IMPLANT
TOWEL GREEN STERILE (TOWEL DISPOSABLE) ×2 IMPLANT
TOWEL GREEN STERILE FF (TOWEL DISPOSABLE) ×2 IMPLANT

## 2020-02-14 NOTE — Interval H&P Note (Signed)
History and Physical Interval Note:  02/14/2020 7:13 AM  Chelsey Martin  has presented today for surgery, with the diagnosis of LEFT BREAST PAPILLOMA.  The various methods of treatment have been discussed with the patient and family. After consideration of risks, benefits and other options for treatment, the patient has consented to  Procedure(s): LEFT BREAST RADIOACTIVE SEED GUIDED EXCISIONAL BREAST BIOPSY (Left) as a surgical intervention.  The patient's history has been reviewed, patient examined, no change in status, stable for surgery.  I have reviewed the patient's chart and labs.  Questions were answered to the patient's satisfaction.     Rolm Bookbinder

## 2020-02-14 NOTE — Anesthesia Procedure Notes (Signed)
Procedure Name: LMA Insertion Date/Time: 02/14/2020 7:34 AM Performed by: Jenne Campus, CRNA Pre-anesthesia Checklist: Patient identified, Emergency Drugs available, Suction available and Patient being monitored Patient Re-evaluated:Patient Re-evaluated prior to induction Oxygen Delivery Method: Circle System Utilized Preoxygenation: Pre-oxygenation with 100% oxygen Induction Type: IV induction Ventilation: Mask ventilation without difficulty LMA: LMA inserted LMA Size: 4.0 Number of attempts: 1 Airway Equipment and Method: Bite block Placement Confirmation: positive ETCO2 and breath sounds checked- equal and bilateral Tube secured with: Tape Dental Injury: Teeth and Oropharynx as per pre-operative assessment

## 2020-02-14 NOTE — Discharge Instructions (Signed)
[facility] Office Phone Number [phone]  BREAST BIOPSY/ PARTIAL MASTECTOMY: POST OP INSTRUCTIONS Take 400 mg of ibuprofen every 8 hours or 650 mg tylenol every 6 hours for next 72 hours then as needed. Use ice several times daily also. Always review your discharge instruction sheet given to you by the facility where your surgery was performed.  IF YOU HAVE DISABILITY OR FAMILY LEAVE FORMS, YOU MUST BRING THEM TO THE OFFICE FOR PROCESSING.  DO NOT GIVE THEM TO YOUR DOCTOR.  1. A prescription for pain medication may be given to you upon discharge.  Take your pain medication as prescribed, if needed.  If narcotic pain medicine is not needed, then you may take acetaminophen (Tylenol), naprosyn (Alleve) or ibuprofen (Advil) as needed. 2. Take your usually prescribed medications unless otherwise directed 3. If you need a refill on your pain medication, please contact your pharmacy.  They will contact our office to request authorization.  Prescriptions will not be filled after 5pm or on week-ends. 4. You should eat very light the first 24 hours after surgery, such as soup, crackers, pudding, etc.  Resume your normal diet the day after surgery. 5. Most patients will experience some swelling and bruising in the breast.  Ice packs and a good support bra will help.  Wear the breast binder provided or a sports bra for 72 hours day and night.  After that wear a sports bra during the day until you return to the office. Swelling and bruising can take several days to resolve.  6. It is common to experience some constipation if taking pain medication after surgery.  Increasing fluid intake and taking a stool softener will usually help or prevent this problem from occurring.  A mild laxative (Milk of Magnesia or Miralax) should be taken according to package directions if there are no bowel movements after 48 hours. 7. Unless discharge instructions indicate otherwise, you may remove your bandages 48  hours after surgery and you may shower at that time.  You may have steri-strips (small skin tapes) in place directly over the incision.  These strips should be left on the skin for 7-10 days and will come off on their own.  If your surgeon used skin glue on the incision, you may shower in 24 hours.  The glue will flake off over the next 2-3 weeks.  Any sutures or staples will be removed at the office during your follow-up visit. 8. ACTIVITIES:  You may resume regular daily activities (gradually increasing) beginning the next day.  Wearing a good support bra or sports bra minimizes pain and swelling.  You may have sexual intercourse when it is comfortable. a. You may drive when you no longer are taking prescription pain medication, you can comfortably wear a seatbelt, and you can safely maneuver your car and apply brakes. b. RETURN TO WORK:  ______________________________________________________________________________________ 9. You should see your doctor in the office for a follow-up appointment approximately two weeks after your surgery.  Your doctor's nurse will typically make your follow-up appointment when she calls you with your pathology report.  Expect your pathology report 3-4 business days after your surgery.  You may call to check if you do not hear from us after three days. 10. OTHER INSTRUCTIONS: _______________________________________________________________________________________________ _____________________________________________________________________________________________________________________________________ _____________________________________________________________________________________________________________________________________ _____________________________________________________________________________________________________________________________________  WHEN TO CALL DR Toy Eisemann: 1. Fever over 101.0 2. Nausea and/or vomiting. 3. Extreme swelling or  bruising. 4. Continued bleeding from incision. 5. Increased pain, redness, or drainage from the incision.  The clinic   staff is available to answer your questions during regular business hours.  Please don't hesitate to call and ask to speak to one of the nurses for clinical concerns.  If you have a medical emergency, go to the nearest emergency room or call 911.  A surgeon from [facility] is always on call at the hospital.  For further questions, please visit [email or web address]  

## 2020-02-14 NOTE — Transfer of Care (Signed)
Immediate Anesthesia Transfer of Care Note  Patient: Chelsey Martin  Procedure(s) Performed: LEFT BREAST RADIOACTIVE SEED GUIDED EXCISIONAL BREAST BIOPSY (Left Breast)  Patient Location: PACU  Anesthesia Type:General  Level of Consciousness: awake, oriented and patient cooperative  Airway & Oxygen Therapy: Patient Spontanous Breathing and Patient connected to nasal cannula oxygen  Post-op Assessment: Report given to RN and Post -op Vital signs reviewed and stable  Post vital signs: Reviewed  Last Vitals:  Vitals Value Taken Time  BP    Temp    Pulse    Resp    SpO2      Last Pain:  Vitals:   02/14/20 0554  PainSc: 0-No pain      Patients Stated Pain Goal: 4 (16/10/96 0454)  Complications: No complications documented.

## 2020-02-14 NOTE — Anesthesia Postprocedure Evaluation (Signed)
Anesthesia Post Note  Patient: Chelsey Martin  Procedure(s) Performed: LEFT BREAST RADIOACTIVE SEED GUIDED EXCISIONAL BREAST BIOPSY (Left Breast)     Patient location during evaluation: PACU Anesthesia Type: General Level of consciousness: awake and alert Pain management: pain level controlled Vital Signs Assessment: post-procedure vital signs reviewed and stable Respiratory status: spontaneous breathing, nonlabored ventilation, respiratory function stable and patient connected to nasal cannula oxygen Cardiovascular status: blood pressure returned to baseline and stable Postop Assessment: no apparent nausea or vomiting Anesthetic complications: no   No complications documented.  Last Vitals:  Vitals:   02/14/20 0900 02/14/20 0907  BP:  (!) 147/86  Pulse: 69 67  Resp: 14 16  Temp:  36.7 C  SpO2: 96% 92%    Last Pain:  Vitals:   02/14/20 0907  PainSc: 0-No pain                 Wilmoth Rasnic DAVID

## 2020-02-14 NOTE — Op Note (Signed)
Preoperative diagnosis: left breast mass, nipple discharge Postoperative diagnosis: same as above Procedure: Left breast seed guided excisional biopsy, left breast ductal excision Surgeon: Dr Serita Grammes Anes: general EBL: minimal Specimens Left breast tissue marked with paint containing two clips and seed Complications none Drains none Sponge and needle count correct dispo recovery stable.  Indications: 69 yof with prior history of bilateral papilloma excisions in 1990s presents with left nipple dc and mass on mm. she has fh of breast cancer in her pgm and mother (age 32) and I have operated on her sister for a papilloma previously. she has been having several months of clear spontaneous left nipple dc weekly. she notes no mass. she had evaluation with mm/us. this shows c density breasts. there is new left breast density measuring 1.2 cm. US shows multiple dilated ducts with an area spanning 2.5 cm in the left breast. there is also a 1.1 cm dilated duct retroareolar. biopsy of both are done the RA area disappeared and is benign tissue, concordant. the other area is .15 cm intraductal papilloma with no atypia. We elected to proceed with seed guided excision and ductal excision.  Procedure: After informed consent obtained patient taken to the OR.  She had a seed placed.  She was given antibiotics.  She was placed under general anesthesia without complication. She was prepped and draped in standard sterile surgical fashion.  I identified the seed in the lower outer quadrant.  I identified the discharging duct and cannulate the lacrimal duct probe.  I infiltrated lidocaine and then made a periareolar incision in order to hide the scar later.  I then identified the lacrimal duct probe and underneath the nipple of the areola I removed the discharging ductal system.  This connected with the biopsy-proven papilloma that had a seed placed next to it.  I excised all this together.  Marked this with  pain.  I then did a mammogram which confirmed the removal of both clips of the radioactive seed.  This was then sent to pathology.  Hemostasis was obtained.  I closed this with 2-0 Vicryl, 3-0 Vicryl, and 5-0 Monocryl.  Glue and Steri-Strips were applied.  She tolerated well was extubated transferred recovery stable.

## 2020-02-14 NOTE — H&P (Signed)
64 yof with prior history of bilateral papilloma excisions in 1990s presents with left nipple dc and mass on mm. she has fh of breast cancer in her pgm and mother (63) and I have operated on her sister for a papilloma previously. she has been having several months of clear spontaneous left nipple dc weekly. she notes no mass. she had evaluation with mm/us. this shows c density breasts. there is new left breast density measuring 1.2 cm. US shows multiple dilated ducts with an area spanning 2.5 cm in the left breast. there is also a 1.1 cm dilated duct retroareolar. biopsy of both are done the RA area disappeared and is benign tissue, concordant. the other area is .15 cm intraductal papilloma with no atypia. she is here to discuss options. this am she noted some dark dc on left breast   Past Surgical History Illene Regulus, CMA; 01/09/2020 10:20 AM) Appendectomy  Breast Biopsy  Bilateral. multiple Breast Mass; Local Excision  Bilateral. Foot Surgery  Bilateral. Hysterectomy (not due to cancer) - Complete   Diagnostic Studies History Lars Mage Spillers, CMA; 01/09/2020 10:20 AM) Colonoscopy  >10 years ago Mammogram  within last year Pap Smear  1-5 years ago  Allergies Sabino Gasser, CMA; 01/09/2020 9:46 AM) No Known Drug Allergies  [01/09/2020]: Allergies Reconciled   Medication History Sabino Gasser, CMA; 01/09/2020 9:47 AM) ALPRAZolam (0.25MG  Tablet, Oral) Active. Cyanocobalamin (1000MCG/ML Solution, Injection) Active. Escitalopram Oxalate (10MG  Tablet, Oral) Active. Klor-Con M20 [facility] Tablet ER, Oral) Active. Losartan Potassium-HCTZ (50-12.5MG  Tablet, Oral) Active. Taltz (80MG /ML Soln Auto-inj, Subcutaneous) Active. Medications Reconciled  Social History Illene Regulus, CMA; 01/09/2020 10:20 AM) Alcohol use  Occasional alcohol use. Caffeine use  Carbonated beverages. No drug use  Tobacco use  Never smoker.  Family History Illene Regulus, Katherine;  01/09/2020 10:20 AM) Arthritis  Mother, Sister. Breast Cancer  Mother. Cancer  Father. Depression  Daughter. Hypertension  Sister.  Pregnancy / Birth History Illene Regulus, CMA; 01/09/2020 10:20 AM) Age at menarche  63 years. Gravida  1 Length (months) of breastfeeding  3-6 Maternal age  47-30 Para  1  Other Problems Illene Regulus, CMA; 01/09/2020 10:20 AM) Anxiety Disorder  Arthritis  Asthma  Heart murmur  High blood pressure  Lump In Breast  Oophorectomy  Bilateral. Thyroid Disease     Review of Systems (Alisha Spillers CMA; 01/09/2020 10:20 AM) General Not Present- Appetite Loss, Chills, Fatigue, Fever, Night Sweats, Weight Gain and Weight Loss. Skin Present- Dryness. Not Present- Change in Wart/Mole, Hives, Jaundice, New Lesions, Non-Healing Wounds, Rash and Ulcer. HEENT Present- Hearing Loss, Seasonal Allergies and Wears glasses/contact lenses. Not Present- Earache, Hoarseness, Nose Bleed, Oral Ulcers, Ringing in the Ears, Sinus Pain, Sore Throat, Visual Disturbances and Yellow Eyes. Respiratory Not Present- Bloody sputum, Chronic Cough, Difficulty Breathing, Snoring and Wheezing. Breast Present- Breast Mass, Nipple Discharge and Skin Changes. Not Present- Breast Pain. Cardiovascular Present- Swelling of Extremities. Not Present- Chest Pain, Difficulty Breathing Lying Down, Leg Cramps, Palpitations, Rapid Heart Rate and Shortness of Breath. Gastrointestinal Present- Hemorrhoids. Not Present- Abdominal Pain, Bloating, Bloody Stool, Change in Bowel Habits, Chronic diarrhea, Constipation, Difficulty Swallowing, Excessive gas, Gets full quickly at meals, Indigestion, Nausea, Rectal Pain and Vomiting. Female Genitourinary Present- Frequency and Urgency. Not Present- Nocturia, Painful Urination and Pelvic Pain. Musculoskeletal Present- Joint Pain, Joint Stiffness and Swelling of Extremities. Not Present- Back Pain, Muscle Pain and Muscle Weakness. Psychiatric  Present- Anxiety. Not Present- Bipolar, Change in Sleep Pattern, Depression, Fearful and Frequent crying. Endocrine Present- Cold  Intolerance. Not Present- Excessive Hunger, Hair Changes, Heat Intolerance, Hot flashes and New Diabetes.  Vitals Sabino Gasser CMA; 01/09/2020 9:48 AM) 01/09/2020 9:47 AM Weight: 287.8 lb Height: 68in Body Surface Area: 2.39 m Body Mass Index: 43.76 kg/m  Temp.: 97.7F (Tympanic)  Pulse: 92 (Regular)  BP: 126/72(Sitting, Left Arm, Standard)       Physical Exam Rolm Bookbinder MD; 01/09/2020 10:51 AM) General Mental Status-Alert. Orientation-Oriented X3.  Breast Nipples Discharge - Left - Bloody. Discharge - Right - None - Right. Breast Lump-No Palpable Breast Mass.  Lymphatic Head & Neck  General Head & Neck Lymphatics: Bilateral - Description - Normal. Axillary  General Axillary Region: Bilateral - Description - Normal. Note: no [place] adenopathy     Assessment & Plan Rolm Bookbinder MD; 01/09/2020 11:15 AM) MASS OF LEFT BREAST ON MAMMOGRAM (N63.20) Story: left breast seed guided excisional biopsy I discussed options with her. I think 6 month follow up reasonable but with bloody dc that we both noted today (this could be biopsy related) she does not want to do that now. we discussed seed guided excision of this area. I think this is most likely benign. we discussed surgery, risks and recovery Impression: I spent 15 minutes seeing patient today NIPPLE DISCHARGE, BLOODY (N64.52)

## 2020-02-15 ENCOUNTER — Encounter (HOSPITAL_COMMUNITY): Payer: Self-pay | Admitting: General Surgery

## 2020-02-19 LAB — SURGICAL PATHOLOGY

## 2020-03-04 DIAGNOSIS — Z111 Encounter for screening for respiratory tuberculosis: Secondary | ICD-10-CM | POA: Diagnosis not present

## 2020-03-04 DIAGNOSIS — M25551 Pain in right hip: Secondary | ICD-10-CM | POA: Diagnosis not present

## 2020-03-04 DIAGNOSIS — L409 Psoriasis, unspecified: Secondary | ICD-10-CM | POA: Diagnosis not present

## 2020-03-04 DIAGNOSIS — L4059 Other psoriatic arthropathy: Secondary | ICD-10-CM | POA: Diagnosis not present

## 2020-04-11 DIAGNOSIS — E876 Hypokalemia: Secondary | ICD-10-CM | POA: Diagnosis not present

## 2020-04-11 DIAGNOSIS — E038 Other specified hypothyroidism: Secondary | ICD-10-CM | POA: Diagnosis not present

## 2020-04-11 DIAGNOSIS — E7800 Pure hypercholesterolemia, unspecified: Secondary | ICD-10-CM | POA: Diagnosis not present

## 2020-04-11 DIAGNOSIS — I10 Essential (primary) hypertension: Secondary | ICD-10-CM | POA: Diagnosis not present

## 2020-04-11 DIAGNOSIS — E063 Autoimmune thyroiditis: Secondary | ICD-10-CM | POA: Diagnosis not present

## 2020-04-16 DIAGNOSIS — I10 Essential (primary) hypertension: Secondary | ICD-10-CM | POA: Diagnosis not present

## 2020-04-16 DIAGNOSIS — E063 Autoimmune thyroiditis: Secondary | ICD-10-CM | POA: Diagnosis not present

## 2020-04-16 DIAGNOSIS — E038 Other specified hypothyroidism: Secondary | ICD-10-CM | POA: Diagnosis not present

## 2020-05-16 DIAGNOSIS — H2511 Age-related nuclear cataract, right eye: Secondary | ICD-10-CM | POA: Diagnosis not present

## 2020-06-13 DIAGNOSIS — E063 Autoimmune thyroiditis: Secondary | ICD-10-CM | POA: Diagnosis not present

## 2020-06-13 DIAGNOSIS — E559 Vitamin D deficiency, unspecified: Secondary | ICD-10-CM | POA: Diagnosis not present

## 2020-06-13 DIAGNOSIS — E038 Other specified hypothyroidism: Secondary | ICD-10-CM | POA: Diagnosis not present

## 2020-06-26 DIAGNOSIS — L4059 Other psoriatic arthropathy: Secondary | ICD-10-CM | POA: Diagnosis not present

## 2020-06-26 DIAGNOSIS — L409 Psoriasis, unspecified: Secondary | ICD-10-CM | POA: Diagnosis not present

## 2020-06-26 DIAGNOSIS — M25551 Pain in right hip: Secondary | ICD-10-CM | POA: Diagnosis not present

## 2020-07-25 DIAGNOSIS — R928 Other abnormal and inconclusive findings on diagnostic imaging of breast: Secondary | ICD-10-CM | POA: Diagnosis not present

## 2020-08-11 ENCOUNTER — Other Ambulatory Visit: Payer: Self-pay

## 2020-08-11 ENCOUNTER — Encounter: Payer: Self-pay | Admitting: Dermatology

## 2020-08-11 ENCOUNTER — Ambulatory Visit: Payer: BC Managed Care – PPO | Admitting: Dermatology

## 2020-08-11 DIAGNOSIS — L400 Psoriasis vulgaris: Secondary | ICD-10-CM | POA: Diagnosis not present

## 2020-08-11 DIAGNOSIS — Z85828 Personal history of other malignant neoplasm of skin: Secondary | ICD-10-CM

## 2020-08-11 MED ORDER — HALOBETASOL PROPIONATE 0.05 % EX OINT: TOPICAL_OINTMENT | Freq: Every evening | CUTANEOUS | 1 refills | Status: DC

## 2020-08-11 NOTE — Patient Instructions (Addendum)
Will start topical ointment x 6 weeks under occlusion, if improvement patient can cancel follow up visit. If topical doesn't help will discuss other therapies at visit may add Acitretin.

## 2020-08-12 ENCOUNTER — Encounter: Payer: Self-pay | Admitting: Dermatology

## 2020-08-12 NOTE — Progress Notes (Signed)
   New Patient   Subjective  Chelsey Martin is a 64 y.o. female who presents for the following: New Patient (Initial Visit) (Patient here today for Psoriasis x years.  Per patient she is currently being treated for psoriatic arthritis Dr. Dierdre Forth.  Currently doing Stelara injections that are helping with the arthritis but not the psoriasis. Per patient her hands and feet are the worse.).  Psoriasis Location: Hands and feet Duration:  Quality:  Associated Signs/Symptoms: Joint symptoms being followed by Dr. Dierdre Forth Modifying Factors: Stelara Severity:  Timing: Context:    The following portions of the chart were reviewed this encounter and updated as appropriate:      Objective  Well appearing patient in no apparent distress; mood and affect are within normal limits. Objective  Left Ankle - Anterior, Left Hand - Anterior, Right Ankle - Anterior, Right Hand - Anterior: Hyperkeratotic scale with inflammation most severe on hands and feet.  No pustules, no nail changes, no synovitis.  Objective  Right Nasal Sidewall: Right upper nose [facility]): No sign of recurrence.  No new skin cancer sun exposed areas are back.    A focused examination was performed including Scalp, ears, face, back, arms, legs, nails, joints.. Relevant physical exam findings are noted in the Assessment and Plan.   Assessment & Plan  Psoriasis vulgaris (4) Left Hand - Anterior; Right Hand - Anterior; Left Ankle - Anterior; Right Ankle - Anterior  Will start with topical halobetasol ointment x 6 weeks under either moist wraps or Saran type occlusion. If improvement cancel follow up visit. If no improvement will discuss other therapies and may add Acitretin.  halobetasol (ULTRAVATE) 0.05 % ointment - Left Ankle - Anterior, Left Hand - Anterior, Right Ankle - Anterior, Right Hand - Anterior  History of basal cell carcinoma (BCC) Right Nasal Sidewall

## 2020-09-23 ENCOUNTER — Ambulatory Visit: Payer: BC Managed Care – PPO | Admitting: Dermatology

## 2020-09-26 DIAGNOSIS — L409 Psoriasis, unspecified: Secondary | ICD-10-CM | POA: Diagnosis not present

## 2020-09-26 DIAGNOSIS — L4059 Other psoriatic arthropathy: Secondary | ICD-10-CM | POA: Diagnosis not present

## 2020-09-26 DIAGNOSIS — M25551 Pain in right hip: Secondary | ICD-10-CM | POA: Diagnosis not present

## 2020-10-10 DIAGNOSIS — E538 Deficiency of other specified B group vitamins: Secondary | ICD-10-CM | POA: Diagnosis not present

## 2020-10-10 DIAGNOSIS — E063 Autoimmune thyroiditis: Secondary | ICD-10-CM | POA: Diagnosis not present

## 2020-10-10 DIAGNOSIS — E7800 Pure hypercholesterolemia, unspecified: Secondary | ICD-10-CM | POA: Diagnosis not present

## 2020-10-10 DIAGNOSIS — E876 Hypokalemia: Secondary | ICD-10-CM | POA: Diagnosis not present

## 2020-10-10 DIAGNOSIS — E038 Other specified hypothyroidism: Secondary | ICD-10-CM | POA: Diagnosis not present

## 2020-10-10 DIAGNOSIS — I10 Essential (primary) hypertension: Secondary | ICD-10-CM | POA: Diagnosis not present

## 2020-10-15 DIAGNOSIS — E038 Other specified hypothyroidism: Secondary | ICD-10-CM | POA: Diagnosis not present

## 2020-10-15 DIAGNOSIS — Z6841 Body Mass Index (BMI) 40.0 and over, adult: Secondary | ICD-10-CM | POA: Diagnosis not present

## 2020-10-15 DIAGNOSIS — E063 Autoimmune thyroiditis: Secondary | ICD-10-CM | POA: Diagnosis not present

## 2020-10-16 DIAGNOSIS — L4059 Other psoriatic arthropathy: Secondary | ICD-10-CM | POA: Diagnosis not present

## 2020-11-13 DIAGNOSIS — L4059 Other psoriatic arthropathy: Secondary | ICD-10-CM | POA: Diagnosis not present

## 2020-11-13 DIAGNOSIS — H35373 Puckering of macula, bilateral: Secondary | ICD-10-CM | POA: Diagnosis not present

## 2020-11-13 DIAGNOSIS — H2513 Age-related nuclear cataract, bilateral: Secondary | ICD-10-CM | POA: Diagnosis not present

## 2020-11-19 ENCOUNTER — Ambulatory Visit: Payer: BC Managed Care – PPO | Admitting: Dermatology

## 2020-12-05 DIAGNOSIS — Z0000 Encounter for general adult medical examination without abnormal findings: Secondary | ICD-10-CM | POA: Diagnosis not present

## 2020-12-08 DIAGNOSIS — H2512 Age-related nuclear cataract, left eye: Secondary | ICD-10-CM | POA: Diagnosis not present

## 2020-12-08 DIAGNOSIS — Z1211 Encounter for screening for malignant neoplasm of colon: Secondary | ICD-10-CM | POA: Diagnosis not present

## 2020-12-16 ENCOUNTER — Other Ambulatory Visit: Payer: Self-pay

## 2020-12-16 ENCOUNTER — Encounter: Payer: Self-pay | Admitting: Ophthalmology

## 2020-12-18 NOTE — Discharge Instructions (Signed)

## 2020-12-23 ENCOUNTER — Ambulatory Visit
Admission: RE | Admit: 2020-12-23 | Discharge: 2020-12-23 | Disposition: A | Discharge status: Home / Self Care | Payer: BC Managed Care – PPO | Attending: Ophthalmology | Admitting: Ophthalmology

## 2020-12-23 ENCOUNTER — Encounter
Admission: RE | Disposition: A | Discharge status: Home / Self Care | Payer: Self-pay | Source: Home / Self Care | Attending: Ophthalmology

## 2020-12-23 ENCOUNTER — Ambulatory Visit: Payer: BC Managed Care – PPO | Admitting: Anesthesiology

## 2020-12-23 ENCOUNTER — Other Ambulatory Visit: Payer: Self-pay

## 2020-12-23 ENCOUNTER — Encounter: Payer: Self-pay | Admitting: Ophthalmology

## 2020-12-23 DIAGNOSIS — Z885 Allergy status to narcotic agent status: Secondary | ICD-10-CM | POA: Insufficient documentation

## 2020-12-23 DIAGNOSIS — Z91010 Allergy to peanuts: Secondary | ICD-10-CM | POA: Diagnosis not present

## 2020-12-23 DIAGNOSIS — Z79899 Other long term (current) drug therapy: Secondary | ICD-10-CM | POA: Insufficient documentation

## 2020-12-23 DIAGNOSIS — Z79890 Hormone replacement therapy (postmenopausal): Secondary | ICD-10-CM | POA: Diagnosis not present

## 2020-12-23 DIAGNOSIS — H25812 Combined forms of age-related cataract, left eye: Secondary | ICD-10-CM | POA: Diagnosis not present

## 2020-12-23 DIAGNOSIS — H2512 Age-related nuclear cataract, left eye: Secondary | ICD-10-CM | POA: Diagnosis not present

## 2020-12-23 HISTORY — DX: Gastro-esophageal reflux disease without esophagitis: K21.9

## 2020-12-23 HISTORY — DX: Presence of external hearing-aid: Z97.4

## 2020-12-23 HISTORY — DX: Unspecified osteoarthritis, unspecified site: M19.90

## 2020-12-23 HISTORY — PX: CATARACT EXTRACTION W/PHACO: SHX586

## 2020-12-23 SURGERY — PHACOEMULSIFICATION, CATARACT, WITH IOL INSERTION
Anesthesia: Monitor Anesthesia Care | Site: Eye | Laterality: Left

## 2020-12-23 MED ORDER — FENTANYL CITRATE (PF) 100 MCG/2ML IJ SOLN
INTRAMUSCULAR | Status: DC | PRN
  Administered 2020-12-23: 50 ug via INTRAVENOUS

## 2020-12-23 MED ORDER — EPINEPHRINE PF 1 MG/ML IJ SOLN
INTRAOCULAR | Status: DC | PRN
  Administered 2020-12-23: 53 mL via OPHTHALMIC

## 2020-12-23 MED ORDER — BRIMONIDINE TARTRATE-TIMOLOL 0.2-0.5 % OP SOLN
OPHTHALMIC | Status: DC | PRN
  Administered 2020-12-23: 1 [drp] via OPHTHALMIC

## 2020-12-23 MED ORDER — MIDAZOLAM HCL 2 MG/2ML IJ SOLN
INTRAMUSCULAR | Status: DC | PRN
  Administered 2020-12-23: 1 mg via INTRAVENOUS

## 2020-12-23 MED ORDER — ONDANSETRON HCL 4 MG/2ML IJ SOLN: 4.0000 mg | Freq: Once | INTRAMUSCULAR | Status: DC | PRN

## 2020-12-23 MED ORDER — MOXIFLOXACIN HCL 0.5 % OP SOLN
OPHTHALMIC | Status: DC | PRN
  Administered 2020-12-23: 0.2 mL via OPHTHALMIC

## 2020-12-23 MED ORDER — LACTATED RINGERS IV SOLN: INTRAVENOUS | Status: DC

## 2020-12-23 MED ORDER — TETRACAINE HCL 0.5 % OP SOLN
1.0000 [drp] | OPHTHALMIC | Status: DC | PRN
  Administered 2020-12-23 (×4): 1 [drp] via OPHTHALMIC

## 2020-12-23 MED ORDER — ACETAMINOPHEN 10 MG/ML IV SOLN: 1000.0000 mg | Freq: Once | INTRAVENOUS | Status: DC | PRN

## 2020-12-23 MED ORDER — ARMC OPHTHALMIC DILATING DROPS
1.0000 "application " | OPHTHALMIC | Status: DC | PRN
  Administered 2020-12-23 (×3): 1 via OPHTHALMIC

## 2020-12-23 MED ORDER — NA CHONDROIT SULF-NA HYALURON 40-17 MG/ML IO SOLN
INTRAOCULAR | Status: DC | PRN
  Administered 2020-12-23: 1 mL via INTRAOCULAR

## 2020-12-23 MED ORDER — LIDOCAINE HCL (PF) 2 % IJ SOLN
INTRAOCULAR | Status: DC | PRN
  Administered 2020-12-23: 1 mL via INTRAMUSCULAR

## 2020-12-23 SURGICAL SUPPLY — 19 items
CANNULA ANT/CHMB 27G (MISCELLANEOUS) ×2 IMPLANT
CANNULA ANT/CHMB 27GA (MISCELLANEOUS) ×4 IMPLANT
GLOVE SURG LX 8.0 MICRO (GLOVE) ×1
GLOVE SURG LX STRL 8.0 MICRO (GLOVE) ×1 IMPLANT
GLOVE SURG TRIUMPH 8.0 PF LTX (GLOVE) ×2 IMPLANT
GOWN STRL REUS W/ TWL LRG LVL3 (GOWN DISPOSABLE) ×2 IMPLANT
GOWN STRL REUS W/TWL LRG LVL3 (GOWN DISPOSABLE) ×4
LENS IOL TECNIS EYHANCE 20.5 (Intraocular Lens) ×1 IMPLANT
MARKER SKIN DUAL TIP RULER LAB (MISCELLANEOUS) ×2 IMPLANT
NDL FILTER BLUNT 18X1 1/2 (NEEDLE) ×1 IMPLANT
NEEDLE FILTER BLUNT 18X 1/2SAF (NEEDLE) ×1
NEEDLE FILTER BLUNT 18X1 1/2 (NEEDLE) ×1 IMPLANT
PACK EYE AFTER SURG (MISCELLANEOUS) ×2 IMPLANT
PACK OPTHALMIC (MISCELLANEOUS) ×2 IMPLANT
PACK PORFILIO (MISCELLANEOUS) ×2 IMPLANT
SYR 3ML LL SCALE MARK (SYRINGE) ×2 IMPLANT
SYR TB 1ML LUER SLIP (SYRINGE) ×2 IMPLANT
WATER STERILE IRR 250ML POUR (IV SOLUTION) ×2 IMPLANT
WIPE NON LINTING 3.25X3.25 (MISCELLANEOUS) ×2 IMPLANT

## 2020-12-23 NOTE — Anesthesia Postprocedure Evaluation (Signed)
Anesthesia Post Note  Patient: Chelsey Martin  Procedure(s) Performed: CATARACT EXTRACTION PHACO AND INTRAOCULAR LENS PLACEMENT (IOC) LEFT (Left Eye)     Patient location during evaluation: PACU Anesthesia Type: MAC Level of consciousness: awake and alert Pain management: pain level controlled Vital Signs Assessment: post-procedure vital signs reviewed and stable Respiratory status: spontaneous breathing, nonlabored ventilation, respiratory function stable and patient connected to nasal cannula oxygen Cardiovascular status: stable and blood pressure returned to baseline Postop Assessment: no apparent nausea or vomiting Anesthetic complications: no   No complications documented.  Naira Standiford A  Natan Hartog

## 2020-12-23 NOTE — Transfer of Care (Signed)
Immediate Anesthesia Transfer of Care Note  Patient: Chelsey Martin  Procedure(s) Performed: CATARACT EXTRACTION PHACO AND INTRAOCULAR LENS PLACEMENT (IOC) LEFT (Left Eye)  Patient Location: PACU  Anesthesia Type: MAC  Level of Consciousness: awake, alert  and patient cooperative  Airway and Oxygen Therapy: Patient Spontanous Breathing and Patient connected to supplemental oxygen  Post-op Assessment: Post-op Vital signs reviewed, Patient's Cardiovascular Status Stable, Respiratory Function Stable, Patent Airway and No signs of Nausea or vomiting  Post-op Vital Signs: Reviewed and stable  Complications: No complications documented.

## 2020-12-23 NOTE — Op Note (Signed)
PREOPERATIVE DIAGNOSIS:  Nuclear sclerotic cataract of the left eye.   POSTOPERATIVE DIAGNOSIS:  Nuclear sclerotic cataract of the left eye.   OPERATIVE PROCEDURE:@   SURGEON:  Birder Robson, MD.   ANESTHESIA:  Anesthesiologist: Heniser, Fredric Dine, MD CRNA: Cameron Ali, CRNA  1.      Managed anesthesia care. 2.     0.60ml of Shugarcaine was instilled following the paracentesis   COMPLICATIONS:  None.   TECHNIQUE:   Stop and chop   DESCRIPTION OF PROCEDURE:  The patient was examined and consented in the preoperative holding area where the aforementioned topical anesthesia was applied to the left eye and then brought back to the Operating Room where the left eye was prepped and draped in the usual sterile ophthalmic fashion and a lid speculum was placed. A paracentesis was created with the side port blade and the anterior chamber was filled with viscoelastic. A near clear corneal incision was performed with the steel keratome. A continuous curvilinear capsulorrhexis was performed with a cystotome followed by the capsulorrhexis forceps. Hydrodissection and hydrodelineation were carried out with BSS on a blunt cannula. The lens was removed in a stop and chop  technique and the remaining cortical material was removed with the irrigation-aspiration handpiece. The capsular bag was inflated with viscoelastic and the Technis [id number] lens was placed in the capsular bag without complication. The remaining viscoelastic was removed from the eye with the irrigation-aspiration handpiece. The wounds were hydrated. The anterior chamber was flushed with BSS and the eye was inflated to physiologic pressure. 0.16ml Vigamox was placed in the anterior chamber. The wounds were found to be water tight. The eye was dressed with Combigan. The patient was given protective glasses to wear throughout the day and a shield with which to sleep tonight. The patient was also given drops with which to begin a drop regimen today and  will follow-up with me in one day. Implant Name Type Inv. Item Serial No. Manufacturer Lot No. LRB No. Used Action  LENS IOL TECNIS EYHANCE 20.5 - [id number] Intraocular Lens LENS IOL TECNIS EYHANCE 20.5 [id number] JOHNSON   Left 1 Implanted    Procedure(s) with comments: CATARACT EXTRACTION PHACO AND INTRAOCULAR LENS PLACEMENT (IOC) LEFT (Left) - CDE 6.67 00:42.9 minutes  Electronically signed: Birder Robson 12/23/2020 8:15 AM

## 2020-12-23 NOTE — H&P (Signed)
[facility]   Primary Care Physician:  Kelton Pillar, MD Ophthalmologist: Dr. George Ina  Pre-Procedure History & Physical: HPI:  Chelsey Martin is a 65 y.o. female here for cataract surgery.   Past Medical History:  Diagnosis Date  . Anemia   . Anxiety   . Arthritis   . Asthma   . Cancer ([place])    basal cell skin cancer on face-surgically removed 2014/2015.   Marland Kitchen GERD (gastroesophageal reflux disease)   . Hemorrhoids   . History of ITP 1987  . Hypothyroidism   . Iron deficiency anemia   . Pneumonia    reports 3-4 years ago.   Marland Kitchen PONV (postoperative nausea and vomiting)   . Sleep apnea    pt denies. States she has never had a sleep study. Denies CPAP use.  Marland Kitchen Unspecified essential hypertension   . Wears hearing aid in both ears     Past Surgical History:  Procedure Laterality Date  . APPENDECTOMY    . BREAST BIOPSY    . HEEL SPUR SURGERY    . RADIOACTIVE SEED GUIDED EXCISIONAL BREAST BIOPSY Left 02/14/2020   Procedure: LEFT BREAST RADIOACTIVE SEED GUIDED EXCISIONAL BREAST BIOPSY;  Surgeon: Rolm Bookbinder, MD;  Location: [facility];  Service: General;  Laterality: Left;  Marland Kitchen VAGINAL HYSTERECTOMY      Prior to Admission medications   Medication Sig Start Date End Date Taking? Authorizing Provider  albuterol (PROVENTIL HFA;VENTOLIN HFA) 108 (90 Base) MCG/ACT inhaler Inhale 2 puffs into the lungs every 6 (six) hours as needed for wheezing or shortness of breath. 11/25/17  Yes Flinchum, Kelby Aline, FNP  ALPRAZolam Duanne Moron) 0.25 MG tablet Take 0.25 mg by mouth 2 (two) times daily as needed for anxiety.    Yes [provider]  B Complex Vitamins (B COMPLEX PO) Take by mouth daily.   Yes [provider]  Cholecalciferol (VITAMIN D3) 2000 units capsule Take 2,000 Units by mouth daily.    Yes [provider]  cyanocobalamin (,VITAMIN B-12,) 1000 MCG/ML injection Inject into the muscle. 06/15/20  Yes [provider]  escitalopram (LEXAPRO) 20 MG  tablet Take 20 mg by mouth daily. 06/12/20  Yes [provider]  esomeprazole ([place]) 40 MG capsule  06/20/20  Yes [provider]  Glucosamine-Chondroitin (OSTEO BI-FLEX REGULAR STRENGTH) 250-200 MG TABS 1 tablet   Yes [provider]  golimumab (Gonzalez ARIA) 50 MG/4ML SOLN injection Inject into the vein every 8 (eight) weeks.   Yes [provider]  halobetasol (ULTRAVATE) 0.05 % ointment Apply topically at bedtime. 08/11/20  Yes Lavonna Monarch, MD  losartan-hydrochlorothiazide (HYZAAR) 50-12.5 MG tablet Take 1 tablet by mouth daily.    Yes [provider]  Misc Natural Products (OSTEO BI-FLEX ADV DOUBLE ST PO) Take 1 tablet by mouth daily.    Yes [provider]  potassium chloride SA (KLOR-CON) 20 MEQ tablet Take 20 mEq by mouth daily.   Yes [provider]  SYNTHROID 175 MCG tablet Take 175 mcg by mouth daily before breakfast.  10/16/17  Yes [provider]  Insulin Syringe-Needle U-100 (INSULIN SYRINGE 1CC/31GX5/16") 31G X 5/16" 1 ML MISC Use as directed for B12 injections 12/29/18   [provider]    Allergies as of 11/14/2020 - Review Complete 08/12/2020  Allergen Reaction Noted  . Hydrocodone-acetaminophen Other (See Comments) 05/24/2020  . Codeine Anxiety 02/11/2020  . Peanut oil Other (See Comments) and Hives 10/07/2016    Family History  Problem Relation Age of Onset  .  Pancreatic cancer Maternal Grandfather   . Emphysema Maternal Grandfather   . Heart disease Maternal Grandmother   . Cancer Mother   . Varicose Veins Mother   . Bleeding Disorder Mother   . Cancer Father   . Hypertension Sister   . Colon cancer Neg Hx     Social History   Socioeconomic History  . Marital status: Married    Spouse name: Not on file  . Number of children: Not on file  . Years of education: Not on file  . Highest education level: Not on file  Occupational History  . Not on file  Tobacco Use  . Smoking  status: Never Smoker  . Smokeless tobacco: Never Used  Vaping Use  . Vaping Use: Never used  Substance and Sexual Activity  . Alcohol use: No    Alcohol/week: 0.0 standard drinks  . Drug use: No  . Sexual activity: Not on file  Other Topics Concern  . Not on file  Social History Narrative  . Not on file   Social Determinants of Health   Financial Resource Strain: Not on file  Food Insecurity: Not on file  Transportation Needs: Not on file  Physical Activity: Not on file  Stress: Not on file  Social Connections: Not on file  Intimate Partner Violence: Not on file    Review of Systems: See HPI, otherwise negative ROS  Physical Exam: BP (!) 166/79   Pulse 65   Temp (!) 97.4 F (36.3 C) (Temporal)   Ht 5\' 8"  (1.727 m)   Wt 133.4 kg   SpO2 96%   BMI 44.70 kg/m  General:   Alert,  pleasant and cooperative in NAD Head:  Normocephalic and atraumatic. Respiratory:  Normal work of breathing. Cardiovascular:  RRR  Impression/Plan: Chelsey Martin is here for cataract surgery.  Risks, benefits, limitations, and alternatives regarding cataract surgery have been reviewed with the patient.  Questions have been answered.  All parties agreeable.   Birder Robson, MD  12/23/2020, 7:48 AM

## 2020-12-23 NOTE — Anesthesia Preprocedure Evaluation (Signed)
Anesthesia Evaluation  Patient identified by MRN, date of birth, ID band Patient awake    Reviewed: Allergy & Precautions, NPO status , Patient's Chart, lab work & pertinent test results, reviewed documented beta blocker date and time   History of Anesthesia Complications (+) PONV and history of anesthetic complications  Airway Mallampati: III  TM Distance: >3 FB Neck ROM: Limited    Dental   Pulmonary asthma , sleep apnea ,    breath sounds clear to auscultation       Cardiovascular hypertension, (-) angina(-) DOE  Rhythm:Regular Rate:Normal     Neuro/Psych Anxiety    GI/Hepatic GERD  ,  Endo/Other  Hypothyroidism   Renal/GU      Musculoskeletal  (+) Arthritis ,   Abdominal (+) + obese (BMI 45),   Peds  Hematology  (+) anemia ,   Anesthesia Other Findings   Reproductive/Obstetrics                             Anesthesia Physical Anesthesia Plan  ASA: III  Anesthesia Plan: MAC   Post-op Pain Management:    Induction: Intravenous  PONV Risk Score and Plan: 3 and TIVA, Midazolam and Treatment may vary due to age or medical condition  Airway Management Planned: Nasal Cannula  Additional Equipment:   Intra-op Plan:   Post-operative Plan:   Informed Consent: I have reviewed the patients History and Physical, chart, labs and discussed the procedure including the risks, benefits and alternatives for the proposed anesthesia with the patient or authorized representative who has indicated his/her understanding and acceptance.       Plan Discussed with: CRNA and Anesthesiologist  Anesthesia Plan Comments:         Anesthesia Quick Evaluation

## 2020-12-24 ENCOUNTER — Encounter: Payer: Self-pay | Admitting: Ophthalmology

## 2020-12-29 DIAGNOSIS — H2511 Age-related nuclear cataract, right eye: Secondary | ICD-10-CM | POA: Diagnosis not present

## 2021-01-04 NOTE — Anesthesia Preprocedure Evaluation (Addendum)
Anesthesia Evaluation  Patient identified by MRN, date of birth, ID band Patient awake    Reviewed: Allergy & Precautions, NPO status , Patient's Chart, lab work & pertinent test results  History of Anesthesia Complications (+) PONV and history of anesthetic complications  Airway Mallampati: IV   Neck ROM: Full    Dental   Upper right crown broken:   Pulmonary asthma , sleep apnea ,    Pulmonary exam normal breath sounds clear to auscultation       Cardiovascular hypertension, Normal cardiovascular exam Rhythm:Regular Rate:Normal     Neuro/Psych PSYCHIATRIC DISORDERS Anxiety HOH    GI/Hepatic GERD  ,  Endo/Other  Hypothyroidism Class 3 obesity  Renal/GU      Musculoskeletal   Abdominal   Peds  Hematology  (+) Blood dyscrasia, anemia , Skin CA   Anesthesia Other Findings   Reproductive/Obstetrics                            Anesthesia Physical Anesthesia Plan  ASA: III  Anesthesia Plan: MAC   Post-op Pain Management:    Induction: Intravenous  PONV Risk Score and Plan: 3 and TIVA, Midazolam and Treatment may vary due to age or medical condition  Airway Management Planned: Nasal Cannula  Additional Equipment:   Intra-op Plan:   Post-operative Plan:   Informed Consent: I have reviewed the patients History and Physical, chart, labs and discussed the procedure including the risks, benefits and alternatives for the proposed anesthesia with the patient or authorized representative who has indicated his/her understanding and acceptance.       Plan Discussed with: CRNA  Anesthesia Plan Comments:        Anesthesia Quick Evaluation

## 2021-01-05 DIAGNOSIS — L409 Psoriasis, unspecified: Secondary | ICD-10-CM | POA: Diagnosis not present

## 2021-01-05 DIAGNOSIS — M25551 Pain in right hip: Secondary | ICD-10-CM | POA: Diagnosis not present

## 2021-01-05 DIAGNOSIS — Z6841 Body Mass Index (BMI) 40.0 and over, adult: Secondary | ICD-10-CM | POA: Diagnosis not present

## 2021-01-05 DIAGNOSIS — L4059 Other psoriatic arthropathy: Secondary | ICD-10-CM | POA: Diagnosis not present

## 2021-01-05 NOTE — Discharge Instructions (Signed)

## 2021-01-06 ENCOUNTER — Ambulatory Visit: Payer: BC Managed Care – PPO | Admitting: Anesthesiology

## 2021-01-06 ENCOUNTER — Ambulatory Visit
Admission: RE | Admit: 2021-01-06 | Discharge: 2021-01-06 | Disposition: A | Discharge status: Home / Self Care | Payer: BC Managed Care – PPO | Attending: Ophthalmology | Admitting: Ophthalmology

## 2021-01-06 ENCOUNTER — Encounter
Admission: RE | Disposition: A | Discharge status: Home / Self Care | Payer: Self-pay | Source: Home / Self Care | Attending: Ophthalmology

## 2021-01-06 ENCOUNTER — Other Ambulatory Visit: Payer: Self-pay

## 2021-01-06 DIAGNOSIS — H25811 Combined forms of age-related cataract, right eye: Secondary | ICD-10-CM | POA: Diagnosis not present

## 2021-01-06 DIAGNOSIS — H2511 Age-related nuclear cataract, right eye: Secondary | ICD-10-CM | POA: Diagnosis not present

## 2021-01-06 DIAGNOSIS — Z79890 Hormone replacement therapy (postmenopausal): Secondary | ICD-10-CM | POA: Diagnosis not present

## 2021-01-06 DIAGNOSIS — Z85828 Personal history of other malignant neoplasm of skin: Secondary | ICD-10-CM | POA: Diagnosis not present

## 2021-01-06 DIAGNOSIS — Z885 Allergy status to narcotic agent status: Secondary | ICD-10-CM | POA: Insufficient documentation

## 2021-01-06 DIAGNOSIS — Z79899 Other long term (current) drug therapy: Secondary | ICD-10-CM | POA: Insufficient documentation

## 2021-01-06 DIAGNOSIS — Z91010 Allergy to peanuts: Secondary | ICD-10-CM | POA: Insufficient documentation

## 2021-01-06 HISTORY — PX: CATARACT EXTRACTION W/PHACO: SHX586

## 2021-01-06 SURGERY — PHACOEMULSIFICATION, CATARACT, WITH IOL INSERTION
Anesthesia: Monitor Anesthesia Care | Site: Eye | Laterality: Right

## 2021-01-06 MED ORDER — MIDAZOLAM HCL 2 MG/2ML IJ SOLN
INTRAMUSCULAR | Status: DC | PRN
  Administered 2021-01-06: 1 mg via INTRAVENOUS

## 2021-01-06 MED ORDER — LACTATED RINGERS IV SOLN: INTRAVENOUS | Status: DC

## 2021-01-06 MED ORDER — ARMC OPHTHALMIC DILATING DROPS
1.0000 "application " | OPHTHALMIC | Status: DC | PRN
  Administered 2021-01-06 (×3): 1 via OPHTHALMIC

## 2021-01-06 MED ORDER — NA CHONDROIT SULF-NA HYALURON 40-17 MG/ML IO SOLN
INTRAOCULAR | Status: DC | PRN
  Administered 2021-01-06: 1 mL via INTRAOCULAR

## 2021-01-06 MED ORDER — MOXIFLOXACIN HCL 0.5 % OP SOLN
OPHTHALMIC | Status: DC | PRN
  Administered 2021-01-06: 0.2 mL via OPHTHALMIC

## 2021-01-06 MED ORDER — TETRACAINE HCL 0.5 % OP SOLN
1.0000 [drp] | OPHTHALMIC | Status: DC | PRN
  Administered 2021-01-06 (×3): 1 [drp] via OPHTHALMIC

## 2021-01-06 MED ORDER — EPINEPHRINE PF 1 MG/ML IJ SOLN
INTRAOCULAR | Status: DC | PRN
  Administered 2021-01-06: 63 mL via OPHTHALMIC

## 2021-01-06 MED ORDER — BRIMONIDINE TARTRATE-TIMOLOL 0.2-0.5 % OP SOLN
OPHTHALMIC | Status: DC | PRN
  Administered 2021-01-06: 1 [drp] via OPHTHALMIC

## 2021-01-06 MED ORDER — FENTANYL CITRATE (PF) 100 MCG/2ML IJ SOLN
INTRAMUSCULAR | Status: DC | PRN
  Administered 2021-01-06: 50 ug via INTRAVENOUS

## 2021-01-06 MED ORDER — LIDOCAINE HCL (PF) 2 % IJ SOLN
INTRAOCULAR | Status: DC | PRN
  Administered 2021-01-06: 1 mL via INTRAMUSCULAR

## 2021-01-06 SURGICAL SUPPLY — 17 items
CANNULA ANT/CHMB 27G (MISCELLANEOUS) ×2 IMPLANT
CANNULA ANT/CHMB 27GA (MISCELLANEOUS) ×4 IMPLANT
GLOVE SURG TRIUMPH 8.0 PF LTX (GLOVE) ×2 IMPLANT
GOWN STRL REUS W/ TWL LRG LVL3 (GOWN DISPOSABLE) ×2 IMPLANT
GOWN STRL REUS W/TWL LRG LVL3 (GOWN DISPOSABLE) ×4
LENS IOL TECNIS EYHANCE 20.0 (Intraocular Lens) ×1 IMPLANT
MARKER SKIN DUAL TIP RULER LAB (MISCELLANEOUS) ×2 IMPLANT
NDL FILTER BLUNT 18X1 1/2 (NEEDLE) ×1 IMPLANT
NEEDLE FILTER BLUNT 18X 1/2SAF (NEEDLE) ×1
NEEDLE FILTER BLUNT 18X1 1/2 (NEEDLE) ×1 IMPLANT
PACK EYE AFTER SURG (MISCELLANEOUS) ×2 IMPLANT
PACK OPTHALMIC (MISCELLANEOUS) ×2 IMPLANT
PACK PORFILIO (MISCELLANEOUS) ×2 IMPLANT
SYR 3ML LL SCALE MARK (SYRINGE) ×2 IMPLANT
SYR TB 1ML LUER SLIP (SYRINGE) ×2 IMPLANT
WATER STERILE IRR 250ML POUR (IV SOLUTION) ×2 IMPLANT
WIPE NON LINTING 3.25X3.25 (MISCELLANEOUS) ×2 IMPLANT

## 2021-01-06 NOTE — Anesthesia Procedure Notes (Signed)
Procedure Name: MAC Date/Time: 01/06/2021 12:06 PM Performed by: Cameron Ali, CRNA Pre-anesthesia Checklist: Patient identified, Emergency Drugs available, Suction available, Timeout performed and Patient being monitored Patient Re-evaluated:Patient Re-evaluated prior to induction Oxygen Delivery Method: Nasal cannula Placement Confirmation: positive ETCO2

## 2021-01-06 NOTE — Anesthesia Postprocedure Evaluation (Signed)
Anesthesia Post Note  Patient: Chelsey Martin  Procedure(s) Performed: CATARACT EXTRACTION PHACO AND INTRAOCULAR LENS PLACEMENT (IOC) RIGHT (Right Eye)     Patient location during evaluation: PACU Anesthesia Type: MAC Level of consciousness: awake and alert, oriented and patient cooperative Pain management: pain level controlled Vital Signs Assessment: post-procedure vital signs reviewed and stable Respiratory status: spontaneous breathing, nonlabored ventilation and respiratory function stable Cardiovascular status: blood pressure returned to baseline and stable Postop Assessment: adequate PO intake Anesthetic complications: no   No complications documented.  Darrin Nipper

## 2021-01-06 NOTE — H&P (Signed)
[facility]   Primary Care Physician:  Kelton Pillar, MD Ophthalmologist: Dr. George Ina  Pre-Procedure History & Physical: HPI:  Chelsey Martin is a 65 y.o. female here for cataract surgery.   Past Medical History:  Diagnosis Date  . Anemia   . Anxiety   . Arthritis   . Asthma   . Cancer ([place])    basal cell skin cancer on face-surgically removed 2014/2015.   Marland Kitchen GERD (gastroesophageal reflux disease)   . Hemorrhoids   . History of ITP 1987  . Hypothyroidism   . Iron deficiency anemia   . Pneumonia    reports 3-4 years ago.   Marland Kitchen PONV (postoperative nausea and vomiting)   . Sleep apnea    pt denies. States she has never had a sleep study. Denies CPAP use.  Marland Kitchen Unspecified essential hypertension   . Wears hearing aid in both ears     Past Surgical History:  Procedure Laterality Date  . APPENDECTOMY    . BREAST BIOPSY    . CATARACT EXTRACTION W/PHACO Left 12/23/2020   Procedure: CATARACT EXTRACTION PHACO AND INTRAOCULAR LENS PLACEMENT ([place]) LEFT;  Surgeon: Birder Robson, MD;  Location: [place];  Service: Ophthalmology;  Laterality: Left;  CDE 6.67 00:42.9 minutes  . HEEL SPUR SURGERY    . RADIOACTIVE SEED GUIDED EXCISIONAL BREAST BIOPSY Left 02/14/2020   Procedure: LEFT BREAST RADIOACTIVE SEED GUIDED EXCISIONAL BREAST BIOPSY;  Surgeon: Rolm Bookbinder, MD;  Location: [place];  Service: General;  Laterality: Left;  Marland Kitchen VAGINAL HYSTERECTOMY      Prior to Admission medications   Medication Sig Start Date End Date Taking? Authorizing Provider  albuterol (PROVENTIL HFA;VENTOLIN HFA) 108 (90 Base) MCG/ACT inhaler Inhale 2 puffs into the lungs every 6 (six) hours as needed for wheezing or shortness of breath. 11/25/17  Yes Flinchum, Kelby Aline, FNP  B Complex Vitamins (B COMPLEX PO) Take by mouth daily.   Yes [provider]  Cholecalciferol (VITAMIN D3) 2000 units capsule Take 2,000 Units by mouth daily.    Yes [provider]  cyanocobalamin  (,VITAMIN B-12,) 1000 MCG/ML injection Inject into the muscle. 06/15/20  Yes [provider]  escitalopram (LEXAPRO) 20 MG tablet Take 20 mg by mouth daily. 06/12/20  Yes [provider]  esomeprazole ([place]) 40 MG capsule  06/20/20  Yes [provider]  Glucosamine-Chondroitin (OSTEO BI-FLEX REGULAR STRENGTH) 250-200 MG TABS 1 tablet   Yes [provider]  golimumab ([place] ARIA) 50 MG/4ML SOLN injection Inject into the vein every 8 (eight) weeks.   Yes [provider]  losartan-hydrochlorothiazide (HYZAAR) 50-12.5 MG tablet Take 1 tablet by mouth daily.    Yes [provider]  Misc Natural Products (OSTEO BI-FLEX ADV DOUBLE ST PO) Take 1 tablet by mouth daily.    Yes [provider]  potassium chloride SA (KLOR-CON) 20 MEQ tablet Take 20 mEq by mouth daily.   Yes [provider]  SYNTHROID 175 MCG tablet Take 175 mcg by mouth daily before breakfast.  10/16/17  Yes [provider]  ALPRAZolam (XANAX) 0.25 MG tablet Take 0.25 mg by mouth 2 (two) times daily as needed for anxiety.     [provider]  halobetasol (ULTRAVATE) 0.05 % ointment Apply topically at bedtime. 08/11/20   Lavonna Monarch, MD  Insulin Syringe-Needle U-100 (INSULIN SYRINGE 1CC/31GX5/16") 31G X 5/16" 1 ML MISC Use as directed for B12 injections 12/29/18   [provider]    Allergies as of 11/14/2020 -  Review Complete 08/12/2020  Allergen Reaction Noted  . Hydrocodone-acetaminophen Other (See Comments) 05/24/2020  . Codeine Anxiety 02/11/2020  . Peanut oil Other (See Comments) and Hives 10/07/2016    Family History  Problem Relation Age of Onset  . Pancreatic cancer Maternal Grandfather   . Emphysema Maternal Grandfather   . Heart disease Maternal Grandmother   . Cancer Mother   . Varicose Veins Mother   . Bleeding Disorder Mother   . Cancer Father   . Hypertension Sister   . Colon cancer Neg Hx     Social History    Socioeconomic History  . Marital status: Married    Spouse name: Not on file  . Number of children: Not on file  . Years of education: Not on file  . Highest education level: Not on file  Occupational History  . Not on file  Tobacco Use  . Smoking status: Never Smoker  . Smokeless tobacco: Never Used  Vaping Use  . Vaping Use: Never used  Substance and Sexual Activity  . Alcohol use: No    Alcohol/week: 0.0 standard drinks  . Drug use: No  . Sexual activity: Not on file  Other Topics Concern  . Not on file  Social History Narrative  . Not on file   Social Determinants of Health   Financial Resource Strain: Not on file  Food Insecurity: Not on file  Transportation Needs: Not on file  Physical Activity: Not on file  Stress: Not on file  Social Connections: Not on file  Intimate Partner Violence: Not on file    Review of Systems: See HPI, otherwise negative ROS  Physical Exam: BP (!) 150/60   Pulse 68   Temp (!) 97.3 F (36.3 C) (Temporal)   Resp 18   Ht 5\' 8"  (1.727 m)   Wt 132.5 kg   SpO2 94%   BMI 44.40 kg/m  General:   Alert,  pleasant and cooperative in NAD Head:  Normocephalic and atraumatic. Respiratory:  Normal work of breathing. Cardiovascular:  RRR  Impression/Plan: Chelsey Martin is here for cataract surgery.  Risks, benefits, limitations, and alternatives regarding cataract surgery have been reviewed with the patient.  Questions have been answered.  All parties agreeable.   Birder Robson, MD  01/06/2021, 11:59 AM

## 2021-01-06 NOTE — Op Note (Signed)
PREOPERATIVE DIAGNOSIS:  Nuclear sclerotic cataract of the right eye.   POSTOPERATIVE DIAGNOSIS:  H25.11 Cataract   OPERATIVE PROCEDURE:@   SURGEON:  Birder Robson, MD.   ANESTHESIA:  Anesthesiologist: Darrin Nipper, MD CRNA: Cameron Ali, CRNA  1.      Managed anesthesia care. 2.      0.87ml of Shugarcaine was instilled in the eye following the paracentesis.   COMPLICATIONS:  None.   TECHNIQUE:   Stop and chop   DESCRIPTION OF PROCEDURE:  The patient was examined and consented in the preoperative holding area where the aforementioned topical anesthesia was applied to the right eye and then brought back to the Operating Room where the right eye was prepped and draped in the usual sterile ophthalmic fashion and a lid speculum was placed. A paracentesis was created with the side port blade and the anterior chamber was filled with viscoelastic. A near clear corneal incision was performed with the steel keratome. A continuous curvilinear capsulorrhexis was performed with a cystotome followed by the capsulorrhexis forceps. Hydrodissection and hydrodelineation were carried out with BSS on a blunt cannula. The lens was removed in a stop and chop  technique and the remaining cortical material was removed with the irrigation-aspiration handpiece. The capsular bag was inflated with viscoelastic and the Technis [id number]  lens was placed in the capsular bag without complication. The remaining viscoelastic was removed from the eye with the irrigation-aspiration handpiece. The wounds were hydrated. The anterior chamber was flushed with BSS and the eye was inflated to physiologic pressure. 0.57ml of Vigamox was placed in the anterior chamber. The wounds were found to be water tight. The eye was dressed with Combigan. The patient was given protective glasses to wear throughout the day and a shield with which to sleep tonight. The patient was also given drops with which to begin a drop regimen today and will  follow-up with me in one day. Implant Name Type Inv. Item Serial No. Manufacturer Lot No. LRB No. Used Action  LENS IOL TECNIS EYHANCE 20.0 - [id number] Intraocular Lens LENS IOL TECNIS EYHANCE 20.0 [id number] JOHNSON   Right 1 Implanted   Procedure(s) with comments: CATARACT EXTRACTION PHACO AND INTRAOCULAR LENS PLACEMENT ([facility]) RIGHT (Right) - 7.37 00:46.8  Electronically signed: Birder Robson 01/06/2021 12:23 PM

## 2021-01-06 NOTE — Transfer of Care (Signed)
Immediate Anesthesia Transfer of Care Note  Patient: Chelsey Martin  Procedure(s) Performed: CATARACT EXTRACTION PHACO AND INTRAOCULAR LENS PLACEMENT ([facility]) RIGHT (Right Eye)  Patient Location: PACU  Anesthesia Type: MAC  Level of Consciousness: awake, alert  and patient cooperative  Airway and Oxygen Therapy: Patient Spontanous Breathing and Patient connected to supplemental oxygen  Post-op Assessment: Post-op Vital signs reviewed, Patient's Cardiovascular Status Stable, Respiratory Function Stable, Patent Airway and No signs of Nausea or vomiting  Post-op Vital Signs: Reviewed and stable  Complications: No complications documented.

## 2021-01-07 ENCOUNTER — Encounter: Payer: Self-pay | Admitting: Ophthalmology

## 2021-01-08 DIAGNOSIS — L4059 Other psoriatic arthropathy: Secondary | ICD-10-CM | POA: Diagnosis not present

## 2021-01-29 DIAGNOSIS — R300 Dysuria: Secondary | ICD-10-CM | POA: Diagnosis not present

## 2021-01-29 DIAGNOSIS — R350 Frequency of micturition: Secondary | ICD-10-CM | POA: Diagnosis not present

## 2021-02-23 ENCOUNTER — Encounter (HOSPITAL_COMMUNITY): Payer: Self-pay

## 2021-02-24 ENCOUNTER — Other Ambulatory Visit: Payer: Self-pay | Admitting: Adult Health

## 2021-03-05 DIAGNOSIS — L4059 Other psoriatic arthropathy: Secondary | ICD-10-CM | POA: Diagnosis not present

## 2021-03-05 DIAGNOSIS — Z79899 Other long term (current) drug therapy: Secondary | ICD-10-CM | POA: Diagnosis not present

## 2021-03-13 DIAGNOSIS — Z1231 Encounter for screening mammogram for malignant neoplasm of breast: Secondary | ICD-10-CM | POA: Diagnosis not present

## 2021-04-02 DIAGNOSIS — N3000 Acute cystitis without hematuria: Secondary | ICD-10-CM | POA: Diagnosis not present

## 2021-04-02 DIAGNOSIS — R300 Dysuria: Secondary | ICD-10-CM | POA: Diagnosis not present

## 2021-04-02 DIAGNOSIS — T3695XA Adverse effect of unspecified systemic antibiotic, initial encounter: Secondary | ICD-10-CM | POA: Diagnosis not present

## 2021-04-09 DIAGNOSIS — H02401 Unspecified ptosis of right eyelid: Secondary | ICD-10-CM | POA: Diagnosis not present

## 2021-04-22 DIAGNOSIS — E038 Other specified hypothyroidism: Secondary | ICD-10-CM | POA: Diagnosis not present

## 2021-04-22 DIAGNOSIS — I10 Essential (primary) hypertension: Secondary | ICD-10-CM | POA: Diagnosis not present

## 2021-04-22 DIAGNOSIS — E876 Hypokalemia: Secondary | ICD-10-CM | POA: Diagnosis not present

## 2021-04-22 DIAGNOSIS — E063 Autoimmune thyroiditis: Secondary | ICD-10-CM | POA: Diagnosis not present

## 2021-04-22 DIAGNOSIS — E7800 Pure hypercholesterolemia, unspecified: Secondary | ICD-10-CM | POA: Diagnosis not present

## 2021-04-24 DIAGNOSIS — I10 Essential (primary) hypertension: Secondary | ICD-10-CM | POA: Diagnosis not present

## 2021-04-24 DIAGNOSIS — E7800 Pure hypercholesterolemia, unspecified: Secondary | ICD-10-CM | POA: Diagnosis not present

## 2021-04-24 DIAGNOSIS — E038 Other specified hypothyroidism: Secondary | ICD-10-CM | POA: Diagnosis not present

## 2021-04-24 DIAGNOSIS — E063 Autoimmune thyroiditis: Secondary | ICD-10-CM | POA: Diagnosis not present

## 2021-04-30 DIAGNOSIS — Z111 Encounter for screening for respiratory tuberculosis: Secondary | ICD-10-CM | POA: Diagnosis not present

## 2021-04-30 DIAGNOSIS — R5383 Other fatigue: Secondary | ICD-10-CM | POA: Diagnosis not present

## 2021-04-30 DIAGNOSIS — Z79899 Other long term (current) drug therapy: Secondary | ICD-10-CM | POA: Diagnosis not present

## 2021-04-30 DIAGNOSIS — L4059 Other psoriatic arthropathy: Secondary | ICD-10-CM | POA: Diagnosis not present

## 2021-05-04 DIAGNOSIS — M25551 Pain in right hip: Secondary | ICD-10-CM | POA: Diagnosis not present

## 2021-05-04 DIAGNOSIS — L409 Psoriasis, unspecified: Secondary | ICD-10-CM | POA: Diagnosis not present

## 2021-05-04 DIAGNOSIS — L4059 Other psoriatic arthropathy: Secondary | ICD-10-CM | POA: Diagnosis not present

## 2021-05-11 DIAGNOSIS — H903 Sensorineural hearing loss, bilateral: Secondary | ICD-10-CM | POA: Diagnosis not present

## 2021-06-05 DIAGNOSIS — R2231 Localized swelling, mass and lump, right upper limb: Secondary | ICD-10-CM | POA: Diagnosis not present

## 2021-06-25 DIAGNOSIS — L4059 Other psoriatic arthropathy: Secondary | ICD-10-CM | POA: Diagnosis not present

## 2021-06-26 DIAGNOSIS — J45909 Unspecified asthma, uncomplicated: Secondary | ICD-10-CM | POA: Diagnosis not present

## 2021-06-26 DIAGNOSIS — I10 Essential (primary) hypertension: Secondary | ICD-10-CM | POA: Diagnosis not present

## 2021-06-26 DIAGNOSIS — H02834 Dermatochalasis of left upper eyelid: Secondary | ICD-10-CM | POA: Diagnosis not present

## 2021-06-26 DIAGNOSIS — Z79899 Other long term (current) drug therapy: Secondary | ICD-10-CM | POA: Diagnosis not present

## 2021-06-26 DIAGNOSIS — E669 Obesity, unspecified: Secondary | ICD-10-CM | POA: Diagnosis not present

## 2021-06-26 DIAGNOSIS — Z6841 Body Mass Index (BMI) 40.0 and over, adult: Secondary | ICD-10-CM | POA: Diagnosis not present

## 2021-06-26 DIAGNOSIS — H02403 Unspecified ptosis of bilateral eyelids: Secondary | ICD-10-CM | POA: Diagnosis not present

## 2021-06-26 DIAGNOSIS — H02831 Dermatochalasis of right upper eyelid: Secondary | ICD-10-CM | POA: Diagnosis not present

## 2021-06-26 DIAGNOSIS — Z79890 Hormone replacement therapy (postmenopausal): Secondary | ICD-10-CM | POA: Diagnosis not present

## 2021-06-26 DIAGNOSIS — K219 Gastro-esophageal reflux disease without esophagitis: Secondary | ICD-10-CM | POA: Diagnosis not present

## 2021-06-26 DIAGNOSIS — E039 Hypothyroidism, unspecified: Secondary | ICD-10-CM | POA: Diagnosis not present

## 2021-07-03 DIAGNOSIS — R2231 Localized swelling, mass and lump, right upper limb: Secondary | ICD-10-CM | POA: Diagnosis not present

## 2021-07-20 ENCOUNTER — Ambulatory Visit: Payer: BC Managed Care – PPO | Admitting: Dermatology

## 2021-08-20 DIAGNOSIS — Z79899 Other long term (current) drug therapy: Secondary | ICD-10-CM | POA: Diagnosis not present

## 2021-08-20 DIAGNOSIS — L4059 Other psoriatic arthropathy: Secondary | ICD-10-CM | POA: Diagnosis not present

## 2021-09-22 DIAGNOSIS — R300 Dysuria: Secondary | ICD-10-CM | POA: Diagnosis not present

## 2021-10-15 DIAGNOSIS — L4059 Other psoriatic arthropathy: Secondary | ICD-10-CM | POA: Diagnosis not present

## 2021-10-21 DIAGNOSIS — E876 Hypokalemia: Secondary | ICD-10-CM | POA: Diagnosis not present

## 2021-10-21 DIAGNOSIS — E063 Autoimmune thyroiditis: Secondary | ICD-10-CM | POA: Diagnosis not present

## 2021-10-21 DIAGNOSIS — I10 Essential (primary) hypertension: Secondary | ICD-10-CM | POA: Diagnosis not present

## 2021-10-21 DIAGNOSIS — E559 Vitamin D deficiency, unspecified: Secondary | ICD-10-CM | POA: Diagnosis not present

## 2021-10-21 DIAGNOSIS — E038 Other specified hypothyroidism: Secondary | ICD-10-CM | POA: Diagnosis not present

## 2021-10-30 DIAGNOSIS — M7989 Other specified soft tissue disorders: Secondary | ICD-10-CM | POA: Diagnosis not present

## 2021-10-30 DIAGNOSIS — L409 Psoriasis, unspecified: Secondary | ICD-10-CM | POA: Diagnosis not present

## 2021-10-30 DIAGNOSIS — M25551 Pain in right hip: Secondary | ICD-10-CM | POA: Diagnosis not present

## 2021-10-30 DIAGNOSIS — L4059 Other psoriatic arthropathy: Secondary | ICD-10-CM | POA: Diagnosis not present

## 2021-12-04 DIAGNOSIS — J209 Acute bronchitis, unspecified: Secondary | ICD-10-CM | POA: Diagnosis not present

## 2021-12-04 DIAGNOSIS — R051 Acute cough: Secondary | ICD-10-CM | POA: Diagnosis not present

## 2021-12-04 DIAGNOSIS — Z03818 Encounter for observation for suspected exposure to other biological agents ruled out: Secondary | ICD-10-CM | POA: Diagnosis not present

## 2021-12-04 DIAGNOSIS — Z20822 Contact with and (suspected) exposure to covid-19: Secondary | ICD-10-CM | POA: Diagnosis not present

## 2021-12-04 DIAGNOSIS — R5383 Other fatigue: Secondary | ICD-10-CM | POA: Diagnosis not present

## 2021-12-10 DIAGNOSIS — L4059 Other psoriatic arthropathy: Secondary | ICD-10-CM | POA: Diagnosis not present

## 2021-12-31 DIAGNOSIS — Z0000 Encounter for general adult medical examination without abnormal findings: Secondary | ICD-10-CM | POA: Diagnosis not present

## 2021-12-31 DIAGNOSIS — Z23 Encounter for immunization: Secondary | ICD-10-CM | POA: Diagnosis not present

## 2021-12-31 DIAGNOSIS — E063 Autoimmune thyroiditis: Secondary | ICD-10-CM | POA: Diagnosis not present

## 2021-12-31 DIAGNOSIS — E538 Deficiency of other specified B group vitamins: Secondary | ICD-10-CM | POA: Diagnosis not present

## 2021-12-31 DIAGNOSIS — F411 Generalized anxiety disorder: Secondary | ICD-10-CM | POA: Diagnosis not present

## 2021-12-31 DIAGNOSIS — D649 Anemia, unspecified: Secondary | ICD-10-CM | POA: Diagnosis not present

## 2021-12-31 DIAGNOSIS — I10 Essential (primary) hypertension: Secondary | ICD-10-CM | POA: Diagnosis not present

## 2021-12-31 DIAGNOSIS — L4050 Arthropathic psoriasis, unspecified: Secondary | ICD-10-CM | POA: Diagnosis not present

## 2021-12-31 DIAGNOSIS — J4520 Mild intermittent asthma, uncomplicated: Secondary | ICD-10-CM | POA: Diagnosis not present

## 2022-02-04 DIAGNOSIS — L4059 Other psoriatic arthropathy: Secondary | ICD-10-CM | POA: Diagnosis not present

## 2022-02-11 DIAGNOSIS — M25551 Pain in right hip: Secondary | ICD-10-CM | POA: Diagnosis not present

## 2022-02-11 DIAGNOSIS — M1991 Primary osteoarthritis, unspecified site: Secondary | ICD-10-CM | POA: Diagnosis not present

## 2022-02-11 DIAGNOSIS — L409 Psoriasis, unspecified: Secondary | ICD-10-CM | POA: Diagnosis not present

## 2022-02-11 DIAGNOSIS — M25561 Pain in right knee: Secondary | ICD-10-CM | POA: Diagnosis not present

## 2022-02-11 DIAGNOSIS — L4059 Other psoriatic arthropathy: Secondary | ICD-10-CM | POA: Diagnosis not present

## 2022-03-13 DIAGNOSIS — Z1231 Encounter for screening mammogram for malignant neoplasm of breast: Secondary | ICD-10-CM | POA: Diagnosis not present

## 2022-03-29 ENCOUNTER — Ambulatory Visit: Payer: BC Managed Care – PPO | Admitting: Dermatology

## 2022-04-01 DIAGNOSIS — L4059 Other psoriatic arthropathy: Secondary | ICD-10-CM | POA: Diagnosis not present

## 2022-04-27 DIAGNOSIS — E038 Other specified hypothyroidism: Secondary | ICD-10-CM | POA: Diagnosis not present

## 2022-04-27 DIAGNOSIS — E7800 Pure hypercholesterolemia, unspecified: Secondary | ICD-10-CM | POA: Diagnosis not present

## 2022-04-27 DIAGNOSIS — E063 Autoimmune thyroiditis: Secondary | ICD-10-CM | POA: Diagnosis not present

## 2022-04-27 DIAGNOSIS — I10 Essential (primary) hypertension: Secondary | ICD-10-CM | POA: Diagnosis not present

## 2022-11-01 ENCOUNTER — Other Ambulatory Visit: Payer: Self-pay | Admitting: Dermatology

## 2022-11-01 ENCOUNTER — Ambulatory Visit (INDEPENDENT_AMBULATORY_CARE_PROVIDER_SITE_OTHER): Payer: Medicare Other | Admitting: Dermatology

## 2022-11-01 ENCOUNTER — Encounter: Payer: Self-pay | Admitting: Dermatology

## 2022-11-01 DIAGNOSIS — L409 Psoriasis, unspecified: Secondary | ICD-10-CM

## 2022-11-01 DIAGNOSIS — L4050 Arthropathic psoriasis, unspecified: Secondary | ICD-10-CM | POA: Diagnosis not present

## 2022-11-01 MED ORDER — TACROLIMUS 0.1 % EX OINT: TOPICAL_OINTMENT | Freq: Two times a day (BID) | CUTANEOUS | 2 refills | Status: DC

## 2022-11-01 MED ORDER — HALOG 0.1 % EX OINT: 1.0000 | TOPICAL_OINTMENT | Freq: Two times a day (BID) | CUTANEOUS | 3 refills | Status: DC

## 2022-11-01 NOTE — Patient Instructions (Signed)
Due to recent changes in healthcare laws, you may see results of your pathology and/or laboratory studies on MyChart before the doctors have had a chance to review them. We understand that in some cases there may be results that are confusing or concerning to you. Please understand that not all results are received at the same time and often the doctors may need to interpret multiple results in order to provide you with the best plan of care or course of treatment. Therefore, we ask that you please give [place] 2 business days to thoroughly review all your results before contacting the office for clarification. Should we see a critical lab result, you will be contacted sooner.   If You Need Anything After Your Visit  If you have any questions or concerns for your doctor, please call our main line at [phone] If no one answers, please leave a voicemail as directed and we will return your call as soon as possible. Messages left after 4 pm will be answered the following business day.   You may also send [place] a message via [place]. We typically respond to MyChart messages within 1-2 business days.  For prescription refills, please ask your pharmacy to contact our office. Our fax number is [phone].  If you have an urgent issue when the clinic is closed that cannot wait until the next business day, you can page your doctor at the number below.    Please note that while we do our best to be available for urgent issues outside of office hours, we are not available 24/7.   If you have an urgent issue and are unable to reach [place], you may choose to seek medical care at your doctor's office, retail clinic, urgent care center, or emergency room.  If you have a medical emergency, please immediately call 911 or go to the emergency department. In the event of inclement weather, please call our main line at [phone] for an update on the status of any delays or closures.  Dermatology Medication Tips: Please  keep the boxes that topical medications come in in order to help keep track of the instructions about where and how to use these. Pharmacies typically print the medication instructions only on the boxes and not directly on the medication tubes.   If your medication is too expensive, please contact our office at [phone] or send [place] a message through [place].   We are unable to tell what your co-pay for medications will be in advance as this is different depending on your insurance coverage. However, we may be able to find a substitute medication at lower cost or fill out paperwork to get insurance to cover a needed medication.   If a prior authorization is required to get your medication covered by your insurance company, please allow [place] 1-2 business days to complete this process.  Drug prices often vary depending on where the prescription is filled and some pharmacies may offer cheaper prices.  The website www.goodrx.com contains coupons for medications through different pharmacies. The prices here do not account for what the cost may be with help from insurance (it may be cheaper with your insurance), but the website can give you the price if you did not use any insurance.  - You can print the associated coupon and take it with your prescription to the pharmacy.  - You may also stop by our office during regular business hours and pick up a GoodRx coupon card.  - If you need your  prescription sent electronically to a different pharmacy, notify our office through [facility] or by phone at [phone]    FOR BRUISING:  Dr. Shanon Brow Recommended Arnica supplements.  It comes in both a topical and oral form and you can buy them at [place] or [facility] [employer] store.

## 2022-11-01 NOTE — Progress Notes (Signed)
   New Patient Visit  Subjective  Chelsey Martin is a 67 y.o. female who presents for the following: Psoriasis (Has had dx for about 10 years. Has arthritis with infusions every 8 weeks. Only on legs. Was given Halobetasol by PCP and will help a little bit. Gets worse if it is not used everyday. Uses Dove sensitive skin soap and Cetaphil. Laundry detergent is not sensitive skin. ).    Objective  Well appearing patient in no apparent distress; mood and affect are within normal limits.  A focused examination was performed including legs. Relevant physical exam findings are noted in the Assessment and Plan.  Left Lower Leg - Anterior, Right Lower Leg - Anterior Well-demarcated erythematous papules/plaques with silvery scale, guttate pink scaly papules.   Right Knee - Anterior Patient complains of joint pain and stiffness, worse in her right knee   Assessment & Plan  Psoriasis Left Lower Leg - Anterior; Right Lower Leg - Anterior  I counseled the patient regarding the following: Skin care: Emollients, ambient sun exposure, shampoos with tar, selenium or zinc pyrithione can improve psoriasis. Expectations: Psoriasis is chronic in nature with periods of remissions and flares. Flares can be triggered by stress, infections (group A strep), certain medications and alcohol. Contact office if: Psoriasis worsens, or fails to improve despite several months of treatment. PGA: 3 BSA: 1    I recommended the following: Continue using Halobetasol for two weeks, then stop. Alternate with Tacrolimus for two weeks  Related Medications tacrolimus (PROTOPIC) 0.1 % ointment Apply topically 2 (two) times daily. Apply topically for two weeks, then stop. Alternate with Halog  Halcinonide (HALOG) 0.1 % OINT Apply 1 Application topically in the morning and at bedtime. Apply topically for two weeks, then stop. Alternate with Tacrolimus  Psoriatic arthritis (HCC) Right Knee - Anterior  Patient  currently under the care of rheumatologist. States she is 80% better after being on Simponi. Pt will continue to follow up with Rheumatologist for PSA.    Return in about 3 months (around 02/01/2023) for psoriasis check.  I, Germaine Pomfret, CMA, am acting as scribe for Langston Reusing, MD.   Documentation: I have reviewed the above documentation for accuracy and completeness, and I agree with the above  Kebrina Friend, DO

## 2023-02-03 ENCOUNTER — Ambulatory Visit: Payer: Medicare Other | Admitting: Dermatology

## 2023-02-09 ENCOUNTER — Encounter: Payer: Self-pay | Admitting: Gastroenterology

## 2023-02-28 ENCOUNTER — Ambulatory Visit: Payer: Medicare Other

## 2023-02-28 ENCOUNTER — Other Ambulatory Visit: Payer: Self-pay

## 2023-02-28 VITALS — Ht 67.0 in | Wt 235.0 lb

## 2023-02-28 DIAGNOSIS — Z1211 Encounter for screening for malignant neoplasm of colon: Secondary | ICD-10-CM

## 2023-02-28 MED ORDER — NA SULFATE-K SULFATE-MG SULF 17.5-3.13-1.6 GM/177ML PO SOLN: 1.0000 | Freq: Once | ORAL | 0 refills | Status: AC

## 2023-02-28 NOTE — Progress Notes (Signed)
 Denies allergies to eggs or soy products. Denies complication of anesthesia or sedation. Denies use of weight loss medication. Denies use of O2.   Emmi instructions given for colonoscopy.  

## 2023-03-18 ENCOUNTER — Ambulatory Visit (AMBULATORY_SURGERY_CENTER): Payer: Medicare Other | Admitting: Gastroenterology

## 2023-03-18 ENCOUNTER — Encounter: Payer: Self-pay | Admitting: Gastroenterology

## 2023-03-18 VITALS — BP 128/58 | HR 61 | Temp 97.8°F | Resp 12 | Ht 67.0 in | Wt 235.0 lb

## 2023-03-18 DIAGNOSIS — Z1211 Encounter for screening for malignant neoplasm of colon: Secondary | ICD-10-CM | POA: Diagnosis not present

## 2023-03-18 DIAGNOSIS — D120 Benign neoplasm of cecum: Secondary | ICD-10-CM | POA: Diagnosis not present

## 2023-03-18 DIAGNOSIS — R195 Other fecal abnormalities: Secondary | ICD-10-CM

## 2023-03-18 MED ORDER — SODIUM CHLORIDE 0.9 % IV SOLN: 500.0000 mL | Freq: Once | INTRAVENOUS | Status: AC

## 2023-03-18 NOTE — Patient Instructions (Signed)
Impression/Recommendations:  Polyp, diverticulosis, and hemorrhoid handouts given to patient.  Resume previous diet. Continue present medications. Await pathology results.  YOU HAD AN ENDOSCOPIC PROCEDURE TODAY AT THE [facility]:   Refer to the procedure report that was given to you for any specific questions about what was found during the examination.  If the procedure report does not answer your questions, please call your gastroenterologist to clarify.  If you requested that your care partner not be given the details of your procedure findings, then the procedure report has been included in a sealed envelope for you to review at your convenience later.  YOU SHOULD EXPECT: Some feelings of bloating in the abdomen. Passage of more gas than usual.  Walking can help get rid of the air that was put into your GI tract during the procedure and reduce the bloating. If you had a lower endoscopy (such as a colonoscopy or flexible sigmoidoscopy) you may notice spotting of blood in your stool or on the toilet paper. If you underwent a bowel prep for your procedure, you may not have a normal bowel movement for a few days.  Please Note:  You might notice some irritation and congestion in your nose or some drainage.  This is from the oxygen used during your procedure.  There is no need for concern and it should clear up in a day or so.  SYMPTOMS TO REPORT IMMEDIATELY:  Following lower endoscopy (colonoscopy or flexible sigmoidoscopy):  Excessive amounts of blood in the stool  Significant tenderness or worsening of abdominal pains  Swelling of the abdomen that is new, acute  Fever of 100F or higher For urgent or emergent issues, a gastroenterologist can be reached at any hour by calling [phone]. Do not use MyChart messaging for urgent concerns.    DIET:  We do recommend a small meal at first, but then you may proceed to your regular diet.  Drink plenty of fluids but you should  avoid alcoholic beverages for 24 hours.  ACTIVITY:  You should plan to take it easy for the rest of today and you should NOT DRIVE or use heavy machinery until tomorrow (because of the sedation medicines used during the test).    FOLLOW UP: Our staff will call the number listed on your records the next business day following your procedure.  We will call around 7:15- 8:00 am to check on you and address any questions or concerns that you may have regarding the information given to you following your procedure. If we do not reach you, we will leave a message.     If any biopsies were taken you will be contacted by phone or by letter within the next 1-3 weeks.  Please call us at [phone] if you have not heard about the biopsies in 3 weeks.    SIGNATURES/CONFIDENTIALITY: You and/or your care partner have signed paperwork which will be entered into your electronic medical record.  These signatures attest to the fact that that the information above on your After Visit Summary has been reviewed and is understood.  Full responsibility of the confidentiality of this discharge information lies with you and/or your care-partner.  

## 2023-03-18 NOTE — Progress Notes (Signed)
Sedate, gd SR, tolerated procedure well, VSS, report to RN 

## 2023-03-18 NOTE — Progress Notes (Signed)
[facility] History and Physical   Primary Care Physician:  Maurice Small, MD (Inactive)   Reason for Procedure:   Positive Cologuard  Plan:    colonoscopy     HPI: Chelsey Martin is a 67 y.o. female  here for colonoscopy - last exam age 10. Positive cologuard a few months ago. She has had some constipation and hemorrhoids but otherwise no other symptoms.  No family history of colon cancer known. Otherwise feels well without any cardiopulmonary symptoms.   I have discussed risks / benefits of anesthesia and endoscopic procedure with Chelsey Martin and they wish to proceed with the exams as outlined today.    Past Medical History:  Diagnosis Date   Allergy    Anemia    Anxiety    Arthritis    Asthma    Blood transfusion without reported diagnosis    Cancer (HCC)    basal cell skin cancer on face-surgically removed 2014/2015.    Cataract    Clotting disorder (HCC)    Depression    GERD (gastroesophageal reflux disease)    Hemorrhoids    History of ITP 1987   Hyperlipidemia    Hypothyroidism    Iron deficiency anemia    Pneumonia    reports 3-4 years ago.    PONV (postoperative nausea and vomiting)    Sleep apnea    pt denies. States she has never had a sleep study. Denies CPAP use.   Unspecified essential hypertension    Wears hearing aid in both ears     Past Surgical History:  Procedure Laterality Date   APPENDECTOMY     BLEPHAROPLASTY Bilateral    BREAST BIOPSY     CATARACT EXTRACTION W/PHACO Left 12/23/2020   Procedure: CATARACT EXTRACTION PHACO AND INTRAOCULAR LENS PLACEMENT (IOC) LEFT;  Surgeon: Galen Manila, MD;  Location: [facility];  Service: Ophthalmology;  Laterality: Left;  CDE 6.67 00:42.9 minutes   CATARACT EXTRACTION W/PHACO Right 01/06/2021   Procedure: CATARACT EXTRACTION PHACO AND INTRAOCULAR LENS PLACEMENT (IOC) RIGHT;  Surgeon: Galen Manila, MD;  Location: [facility];  Service: Ophthalmology;   Laterality: Right;  7.37 00:46.8   HEEL SPUR SURGERY     RADIOACTIVE SEED GUIDED EXCISIONAL BREAST BIOPSY Left 02/14/2020   Procedure: LEFT BREAST RADIOACTIVE SEED GUIDED EXCISIONAL BREAST BIOPSY;  Surgeon: Emelia Loron, MD;  Location: [facility] OR;  Service: General;  Laterality: Left;   VAGINAL HYSTERECTOMY      Prior to Admission medications   Medication Sig Start Date End Date Taking? Authorizing Provider  B Complex Vitamins (B COMPLEX PO) Take by mouth daily.   Yes [provider]  Cholecalciferol (VITAMIN D3) 2000 units capsule Take 2,000 Units by mouth daily.    Yes [provider]  escitalopram (LEXAPRO) 20 MG tablet Take 20 mg by mouth daily. 06/12/20  Yes [provider]  Glucosamine-Chondroitin (OSTEO BI-FLEX REGULAR STRENGTH) 250-200 MG TABS 1 tablet   Yes [provider]  losartan (COZAAR) 50 MG tablet Take 50 mg by mouth daily.   Yes [provider]  potassium chloride SA (KLOR-CON) 20 MEQ tablet Take 20 mEq by mouth daily.   Yes [provider]  SYNTHROID 175 MCG tablet Take 175 mcg by mouth daily before breakfast.  10/16/17  Yes [provider]  albuterol (PROVENTIL HFA;VENTOLIN HFA) 108 (90 Base) MCG/ACT inhaler Inhale 2 puffs into the lungs every 6 (six) hours as needed for wheezing or shortness of breath. 11/25/17   Flinchum, Marcelino Duster  S, FNP  ALPRAZolam (XANAX) 0.25 MG tablet Take 0.25 mg by mouth 2 (two) times daily as needed for anxiety.     [provider]  cyanocobalamin (,VITAMIN B-12,) 1000 MCG/ML injection Inject into the muscle. 06/15/20   [provider]  golimumab (SIMPONI ARIA) 50 MG/4ML SOLN injection Inject into the vein every 8 (eight) weeks.    [provider]  Insulin Syringe-Needle U-100 (INSULIN SYRINGE 1CC/31GX5/16") 31G X 5/16" 1 ML MISC Use as directed for B12 injections 12/29/18   [provider]  Misc Natural Products (OSTEO BI-FLEX ADV DOUBLE ST PO) Take 1  tablet by mouth daily.     [provider]  mometasone (ELOCON) 0.1 % cream Apply 1 Application topically daily as needed (Rash). Apply topically twice a day for up to two weeks 11/24/22   Terri Piedra, DO    Current Outpatient Medications  Medication Sig Dispense Refill   B Complex Vitamins (B COMPLEX PO) Take by mouth daily.     Cholecalciferol (VITAMIN D3) 2000 units capsule Take 2,000 Units by mouth daily.      escitalopram (LEXAPRO) 20 MG tablet Take 20 mg by mouth daily.     Glucosamine-Chondroitin (OSTEO BI-FLEX REGULAR STRENGTH) 250-200 MG TABS 1 tablet     losartan (COZAAR) 50 MG tablet Take 50 mg by mouth daily.     potassium chloride SA (KLOR-CON) 20 MEQ tablet Take 20 mEq by mouth daily.     SYNTHROID 175 MCG tablet Take 175 mcg by mouth daily before breakfast.   4   albuterol (PROVENTIL HFA;VENTOLIN HFA) 108 (90 Base) MCG/ACT inhaler Inhale 2 puffs into the lungs every 6 (six) hours as needed for wheezing or shortness of breath. 1 Inhaler 0   ALPRAZolam (XANAX) 0.25 MG tablet Take 0.25 mg by mouth 2 (two) times daily as needed for anxiety.      cyanocobalamin (,VITAMIN B-12,) 1000 MCG/ML injection Inject into the muscle.     golimumab (SIMPONI ARIA) 50 MG/4ML SOLN injection Inject into the vein every 8 (eight) weeks.     Insulin Syringe-Needle U-100 (INSULIN SYRINGE 1CC/31GX5/16") 31G X 5/16" 1 ML MISC Use as directed for B12 injections     Misc Natural Products (OSTEO BI-FLEX ADV DOUBLE ST PO) Take 1 tablet by mouth daily.      mometasone (ELOCON) 0.1 % cream Apply 1 Application topically daily as needed (Rash). Apply topically twice a day for up to two weeks 45 g 0   Current Facility-Administered Medications  Medication Dose Route Frequency Provider Last Rate Last Admin   0.9 %  sodium chloride infusion  500 mL Intravenous Once Neeva Trew, Willaim Rayas, MD        Allergies as of 03/18/2023 - Review Complete 03/18/2023  Allergen Reaction Noted   Codeine Anxiety  02/11/2020   Hydrocodone-acetaminophen Anxiety 05/24/2020   Peanut oil Other (See Comments) and Hives 10/07/2016    Family History  Problem Relation Age of Onset   Cancer Mother    Varicose Veins Mother    Bleeding Disorder Mother    Cancer Father    Hypertension Sister    Heart disease Maternal Grandmother    Stomach cancer Maternal Grandfather    Pancreatic cancer Maternal Grandfather    Emphysema Maternal Grandfather    Colon cancer Neg Hx    Esophageal cancer Neg Hx    Rectal cancer Neg Hx     Social History   Socioeconomic History   Marital status: Married  Spouse name: Not on file   Number of children: Not on file   Years of education: Not on file   Highest education level: Not on file  Occupational History   Not on file  Tobacco Use   Smoking status: Never   Smokeless tobacco: Never  Vaping Use   Vaping status: Never Used  Substance and Sexual Activity   Alcohol use: No    Alcohol/week: 0.0 standard drinks of alcohol   Drug use: No   Sexual activity: Not on file  Other Topics Concern   Not on file  Social History Narrative   Not on file   Social Determinants of Health   Financial Resource Strain: Not on file  Food Insecurity: Not on file  Transportation Needs: Not on file  Physical Activity: Not on file  Stress: Not on file  Social Connections: Not on file  Intimate Partner Violence: Not on file    Review of Systems: All other review of systems negative except as mentioned in the HPI.  Physical Exam: Vital signs BP (!) 153/72   Pulse 78   Temp 97.8 F (36.6 C)   Ht 5\' 7"  (1.702 m)   Wt 235 lb (106.6 kg)   SpO2 93%   BMI 36.81 kg/m   General:   Alert,  Well-developed, pleasant and cooperative in NAD Lungs:  Clear throughout to auscultation.   Heart:  Regular rate and rhythm Abdomen:  Soft, nontender and nondistended.   Neuro/Psych:  Alert and cooperative. Normal mood and affect. A and O x 3  Harlin Rain, MD [facility]  Gastroenterology

## 2023-03-18 NOTE — Progress Notes (Signed)
Pt's states no medical or surgical changes since previsit or office visit. 

## 2023-03-18 NOTE — Op Note (Signed)
[facility] Patient Name: Chelsey Martin Procedure Date: 03/18/2023 1:52 PM MRN: [id number] Endoscopist: Viviann Spare P. Adela Lank , MD, [id number] Age: 67 Referring MD:  Date of Birth: 09-Jul-1956 Gender: Female Account #: [id number] Procedure:                Colonoscopy Indications:              Positive Cologuard test Medicines:                Monitored Anesthesia Care Procedure:                Pre-Anesthesia Assessment:                           - Prior to the procedure, a History and Physical                            was performed, and patient medications and                            allergies were reviewed. The patient's tolerance of                            previous anesthesia was also reviewed. The risks                            and benefits of the procedure and the sedation                            options and risks were discussed with the patient.                            All questions were answered, and informed consent                            was obtained. Prior Anticoagulants: The patient has                            taken no anticoagulant or antiplatelet agents. ASA                            Grade Assessment: II - A patient with mild systemic                            disease. After reviewing the risks and benefits,                            the patient was deemed in satisfactory condition to                            undergo the procedure.                           After obtaining informed consent, the colonoscope  was passed under direct vision. Throughout the                            procedure, the patient's blood pressure, pulse, and                            oxygen saturations were monitored continuously. The                            Olympus Scope SN: [id number] was introduced through                            the anus and advanced to the the cecum, identified                            by appendiceal orifice and  ileocecal valve. The                            colonoscopy was performed without difficulty. The                            patient tolerated the procedure well. The quality                            of the bowel preparation was good. The ileocecal                            valve, appendiceal orifice, and rectum were                            photographed. Scope In: 1:59:42 PM Scope Out: 2:13:57 PM Scope Withdrawal Time: 0 hours 11 minutes 20 seconds  Total Procedure Duration: 0 hours 14 minutes 15 seconds  Findings:                 The perianal and digital rectal examinations were                            normal.                           A diminutive polyp was found in the cecum. The                            polyp was sessile. The polyp was removed with a                            cold snare. Resection and retrieval were complete.                           There was a medium-sized lipoma, in the ascending                            colon.  A few small-mouthed diverticula were found in the                            sigmoid colon.                           Internal hemorrhoids were found during                            retroflexion. The hemorrhoids were small.                           The exam was otherwise without abnormality. Complications:            No immediate complications. Estimated blood loss:                            Minimal. Estimated Blood Loss:     Estimated blood loss was minimal. Impression:               - One diminutive polyp in the cecum, removed with a                            cold snare. Resected and retrieved.                           - Medium-sized lipoma in the ascending colon.                           - Diverticulosis in the sigmoid colon.                           - Internal hemorrhoids.                           - The examination was otherwise normal.                           No concerning pathology noted - likely  false                            positive Cologuard test. Recommendation:           - Patient has a contact number available for                            emergencies. The signs and symptoms of potential                            delayed complications were discussed with the                            patient. Return to normal activities tomorrow.                            Written discharge instructions were provided to the  patient.                           - Resume previous diet.                           - Continue present medications.                           - Await pathology results. Viviann Spare P. Alana Dayton, MD 03/18/2023 2:17:44 PM This report has been signed electronically.

## 2023-03-21 ENCOUNTER — Telehealth: Payer: Self-pay

## 2023-03-21 NOTE — Telephone Encounter (Signed)
  Follow up Call-     03/18/2023    1:11 PM  Call back number  Post procedure Call Back phone  # [phone]  Permission to leave phone message Yes     Patient questions:  Do you have a fever, pain , or abdominal swelling? No. Pain Score  0 *  Have you tolerated food without any problems? Yes.    Have you been able to return to your normal activities? Yes.    Do you have any questions about your discharge instructions: Diet   No. Medications  No. Follow up visit  No.  Do you have questions or concerns about your Care? No.  Actions: * If pain score is 4 or above: No action needed, pain <4.

## 2023-03-29 ENCOUNTER — Encounter: Payer: Self-pay | Admitting: Gastroenterology

## 2024-01-05 ENCOUNTER — Other Ambulatory Visit (HOSPITAL_BASED_OUTPATIENT_CLINIC_OR_DEPARTMENT_OTHER): Payer: Self-pay | Admitting: Internal Medicine

## 2024-01-05 DIAGNOSIS — Z136 Encounter for screening for cardiovascular disorders: Secondary | ICD-10-CM

## 2024-01-24 ENCOUNTER — Ambulatory Visit (HOSPITAL_BASED_OUTPATIENT_CLINIC_OR_DEPARTMENT_OTHER)
Admission: RE | Admit: 2024-01-24 | Discharge: 2024-01-24 | Disposition: A | Discharge status: Home / Self Care | Payer: Self-pay | Source: Ambulatory Visit | Attending: Internal Medicine | Admitting: Internal Medicine

## 2024-01-24 DIAGNOSIS — Z136 Encounter for screening for cardiovascular disorders: Secondary | ICD-10-CM | POA: Insufficient documentation
